# Patient Record
Sex: Female | Born: 1990 | Race: Black or African American | Hispanic: No | Marital: Single | Smoking: Never smoker
Health system: Southern US, Community
[De-identification: ages and names within clinical notes are randomized; demographics above are authoritative.]

## PROBLEM LIST (undated history)

## (undated) ENCOUNTER — Inpatient Hospital Stay (HOSPITAL_COMMUNITY): Payer: Self-pay

## (undated) DIAGNOSIS — D649 Anemia, unspecified: Secondary | ICD-10-CM

## (undated) DIAGNOSIS — R51 Headache: Secondary | ICD-10-CM

## (undated) DIAGNOSIS — R519 Headache, unspecified: Secondary | ICD-10-CM

## (undated) HISTORY — PX: WISDOM TOOTH EXTRACTION: SHX21

---
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
## 2010-11-21 ENCOUNTER — Inpatient Hospital Stay (INDEPENDENT_AMBULATORY_CARE_PROVIDER_SITE_OTHER)
Admission: RE | Admit: 2010-11-21 | Discharge: 2010-11-21 | Disposition: A | Discharge status: Home / Self Care | Payer: Medicaid Other | Source: Ambulatory Visit | Attending: Family Medicine | Admitting: Family Medicine

## 2010-11-21 DIAGNOSIS — N760 Acute vaginitis: Secondary | ICD-10-CM

## 2010-11-21 DIAGNOSIS — A499 Bacterial infection, unspecified: Secondary | ICD-10-CM

## 2010-11-21 LAB — POCT URINALYSIS DIP (DEVICE)
Glucose, UA: NEGATIVE mg/dL
Hgb urine dipstick: NEGATIVE
Ketones, ur: NEGATIVE mg/dL
Nitrite: NEGATIVE
Protein, ur: NEGATIVE mg/dL
Specific Gravity, Urine: 1.02 (ref 1.005–1.030)
Urobilinogen, UA: 1 mg/dL (ref 0.0–1.0)
pH: 7 (ref 5.0–8.0)

## 2010-11-21 LAB — WET PREP, GENITAL
Clue Cells Wet Prep HPF POC: NONE SEEN
Trich, Wet Prep: NONE SEEN

## 2010-11-22 LAB — GC/CHLAMYDIA PROBE AMP, GENITAL: GC Probe Amp, Genital: NEGATIVE

## 2010-11-29 ENCOUNTER — Emergency Department (HOSPITAL_COMMUNITY)
Admission: EM | Admit: 2010-11-29 | Discharge: 2010-11-29 | Disposition: A | Discharge status: Home / Self Care | Payer: Medicaid Other | Attending: Emergency Medicine | Admitting: Emergency Medicine

## 2010-11-29 DIAGNOSIS — X58XXXA Exposure to other specified factors, initial encounter: Secondary | ICD-10-CM | POA: Insufficient documentation

## 2010-11-29 DIAGNOSIS — M545 Low back pain, unspecified: Secondary | ICD-10-CM | POA: Insufficient documentation

## 2010-11-29 DIAGNOSIS — S335XXA Sprain of ligaments of lumbar spine, initial encounter: Secondary | ICD-10-CM | POA: Insufficient documentation

## 2010-11-29 LAB — URINALYSIS, ROUTINE W REFLEX MICROSCOPIC
Bilirubin Urine: NEGATIVE
Nitrite: NEGATIVE
Urobilinogen, UA: 0.2 mg/dL (ref 0.0–1.0)

## 2010-11-29 LAB — URINE MICROSCOPIC-ADD ON

## 2011-05-15 LAB — CBC
HCT: 34.1 — ABNORMAL LOW
HCT: 37.3
Hemoglobin: 11.7 — ABNORMAL LOW
Hemoglobin: 12.8
MCV: 97.5
Platelets: 199
Platelets: 212
WBC: 12.9 — ABNORMAL HIGH
WBC: 8.3

## 2011-05-15 LAB — RPR: RPR Ser Ql: NONREACTIVE

## 2011-05-25 LAB — CBC
MCHC: 34.4
MCV: 96.1
RBC: 4.15
WBC: 7.7

## 2011-05-25 LAB — RPR: RPR Ser Ql: NONREACTIVE

## 2014-11-06 ENCOUNTER — Emergency Department (INDEPENDENT_AMBULATORY_CARE_PROVIDER_SITE_OTHER): Payer: Medicaid Other

## 2014-11-06 ENCOUNTER — Encounter (HOSPITAL_COMMUNITY): Payer: Self-pay

## 2014-11-06 ENCOUNTER — Emergency Department (INDEPENDENT_AMBULATORY_CARE_PROVIDER_SITE_OTHER)
Admission: EM | Admit: 2014-11-06 | Discharge: 2014-11-06 | Disposition: A | Discharge status: Home / Self Care | Payer: Self-pay | Source: Home / Self Care | Attending: Emergency Medicine | Admitting: Emergency Medicine

## 2014-11-06 DIAGNOSIS — J209 Acute bronchitis, unspecified: Secondary | ICD-10-CM

## 2014-11-06 MED ORDER — PREDNISONE 50 MG PO TABS: ORAL_TABLET | ORAL | Status: DC

## 2014-11-06 MED ORDER — AZITHROMYCIN 250 MG PO TABS: ORAL_TABLET | ORAL | Status: DC

## 2014-11-06 NOTE — Discharge Instructions (Signed)
You have acute bronchitis. Take prednisone and azithromycin as prescribed. You should start to feel better in the next 2-3 days. If you develop high fevers, vomiting, or are getting more short of breath, please go to the emergency room.

## 2014-11-06 NOTE — ED Notes (Signed)
C/o SOB since yesterday, minimal relief w OTC medications. Laying down on bed, talking on phone at time of DC. NAD

## 2014-11-06 NOTE — ED Provider Notes (Signed)
CSN: [id number]     Arrival date & time 11/06/14  1247 History   First MD Initiated Contact with Patient 11/06/14 1321     Chief Complaint  Patient presents with  . Shortness of Breath   (Consider location/radiation/quality/duration/timing/severity/associated sxs/prior Treatment) HPI  She is a 24 year old woman here for evaluation of shortness of breath. She states she has had some nasal congestion for the last week or so. She has also had a cough. Last night she woke up from her stuffy nose and took some Alka-Seltzer plus. An hour or 2 later she woke up again feeling short of breath and with some chest pain. She reports continued shortness of breath and chest discomfort.  She also describes a sore throat. The chest discomfort and sore throat are worse with coughing. No nausea or vomiting. No fevers.  History reviewed. No pertinent past medical history. History reviewed. No pertinent past surgical history. History reviewed. No pertinent family history. History  Substance Use Topics  . Smoking status: Never Smoker   . Smokeless tobacco: Not on file  . Alcohol Use: No   OB History    No data available     Review of Systems  Constitutional: Negative for fever.  HENT: Positive for congestion and sore throat. Negative for rhinorrhea.   Respiratory: Positive for cough and shortness of breath.   Cardiovascular: Positive for chest pain.  Gastrointestinal: Negative for nausea and vomiting.    Allergies  Review of patient's allergies indicates no known allergies.  Home Medications   Prior to Admission medications   Medication Sig Start Date End Date Taking? Authorizing Provider  azithromycin (ZITHROMAX Z-PAK) 250 MG tablet Take 2 pills today, then 1 pill daily until gone. 11/06/14   Charm RingsErin J Honig, MD  predniSONE (DELTASONE) 50 MG tablet Take 1 pill daily for 5 days. 11/06/14   Charm RingsErin J Honig, MD   BP 102/66 mmHg  Pulse 145  Temp(Src) 97.4 F (36.3 C) (Oral)  SpO2 98%  LMP 10/30/2014  (Exact Date) Physical Exam  Constitutional: She is oriented to person, place, and time. She appears well-developed and well-nourished. No distress.  HENT:  Mouth/Throat: Posterior oropharyngeal erythema present. No oropharyngeal exudate.  Neck: Neck supple.  Cardiovascular: S1 normal and S2 normal.  An irregular rhythm present. Tachycardia present.   No murmur heard. Pulmonary/Chest: Effort normal and breath sounds normal. No respiratory distress. She has no wheezes. She has no rales.  Lymphadenopathy:    She has no cervical adenopathy.  Neurological: She is alert and oriented to person, place, and time.    ED Course  Procedures (including critical care time) ED ECG REPORT   Date: 11/06/2014  Rate: 85  Rhythm: normal sinus rhythm  QRS Axis: normal  Intervals: normal  ST/T Wave abnormalities: normal  Conduction Disutrbances:none  Narrative Interpretation: Normal ekg  Old EKG Reviewed: none available  I have personally reviewed the EKG tracing and agree with the computerized printout as noted.  Labs Review Labs Reviewed - No data to display  Imaging Review Dg Chest 2 View  11/06/2014   CLINICAL DATA:  Two day history of cough and shortness of breath  EXAM: CHEST  2 VIEW  COMPARISON:  None.  FINDINGS: Lungs are clear. Heart size and pulmonary vascularity are normal. No adenopathy. 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  IMPRESSION: No edema or consolidation.   Electronically Signed   By: Bretta BangWilliam  Woodruff III M.D.   On: 11/06/2014 14:08     MDM   1. Acute  bronchitis, unspecified organism    We'll treat with prednisone and Z-Pak. Return precautions reviewed as in after visit summary.    Charm Rings, MD 11/06/14 [phone]

## 2016-01-04 ENCOUNTER — Encounter (HOSPITAL_COMMUNITY): Payer: Self-pay | Admitting: *Deleted

## 2016-01-04 ENCOUNTER — Inpatient Hospital Stay (HOSPITAL_COMMUNITY)
Admission: AD | Admit: 2016-01-04 | Discharge: 2016-01-04 | Disposition: A | Discharge status: Home / Self Care | Payer: Medicaid Other | Source: Ambulatory Visit | Attending: Obstetrics & Gynecology | Admitting: Obstetrics & Gynecology

## 2016-01-04 DIAGNOSIS — R102 Pelvic and perineal pain: Secondary | ICD-10-CM | POA: Insufficient documentation

## 2016-01-04 DIAGNOSIS — N946 Dysmenorrhea, unspecified: Secondary | ICD-10-CM | POA: Insufficient documentation

## 2016-01-04 DIAGNOSIS — G8929 Other chronic pain: Secondary | ICD-10-CM | POA: Insufficient documentation

## 2016-01-04 LAB — URINE MICROSCOPIC-ADD ON

## 2016-01-04 LAB — URINALYSIS, ROUTINE W REFLEX MICROSCOPIC
Bilirubin Urine: NEGATIVE
Glucose, UA: NEGATIVE mg/dL
KETONES UR: NEGATIVE mg/dL
LEUKOCYTES UA: NEGATIVE
NITRITE: NEGATIVE
Protein, ur: NEGATIVE mg/dL
SPECIFIC GRAVITY, URINE: 1.01 (ref 1.005–1.030)
pH: 6 (ref 5.0–8.0)

## 2016-01-04 LAB — POCT PREGNANCY, URINE: PREG TEST UR: NEGATIVE

## 2016-01-04 MED ORDER — OXYCODONE-ACETAMINOPHEN 5-325 MG PO TABS: 1.0000 | ORAL_TABLET | Freq: Four times a day (QID) | ORAL | Status: DC | PRN

## 2016-01-04 MED ORDER — OXYCODONE-ACETAMINOPHEN 5-325 MG PO TABS
2.0000 | ORAL_TABLET | Freq: Once | ORAL | Status: AC
  Administered 2016-01-04: 2 via ORAL
  Filled 2016-01-04: qty 2

## 2016-01-04 NOTE — MAU Provider Note (Signed)
History     CSN: [id number]  Arrival date and time: 01/04/16 [id number]   First Provider Initiated Contact with Patient 01/04/16 2002      Chief Complaint  Patient presents with  . Abdominal Pain   HPI Ms. Diane [name] is a 25 y.o. [id number] who presents to MAU today with complaint of abdominal cramping. The patient states that pain has been present x 2 years and is worse with periods. [facility] and had negative infection testing within the last few months. She is sexually active, but denies any new partners or abnormal vaginal discharge. She states that she was put on OCPs at that time. She is unsure which brand of OCPs, but states that she is only supposed to have a period q 3 months. LMP started on Sunday as scheduled with OCPs. She has had breakthrough spotting occasionally since starting OCPs as well. She does not feel that pain has improved at all over the last 3 months. She rates pain at 7/10 now. She takes Ibuprofen and Tylenol without relief. She states that she has used 4 pads today. Periods generally last ~ 7 days.   OB History    Gravida Para Term Preterm AB TAB SAB Ectopic Multiple Living   2 2 2       2       Past Medical History  Diagnosis Date  . Medical history non-contributory     Past Surgical History  Procedure Laterality Date  . No past surgeries      History reviewed. No pertinent family history.  Social History  Substance Use Topics  . Smoking status: Never Smoker   . Smokeless tobacco: Never Used  . Alcohol Use: No    Allergies: No Known Allergies  No prescriptions prior to admission    Review of Systems  Constitutional: Negative for fever and malaise/fatigue.  Gastrointestinal: Positive for abdominal pain. Negative for nausea, vomiting, diarrhea and constipation.  Genitourinary: Negative for dysuria, urgency and frequency.       + vaginal bleeding   Physical Exam   Blood pressure 122/76, pulse 97, temperature 98 F (36.7 C),  temperature source Oral, resp. rate 16, height 5\' 6"  (1.676 m), weight 145 lb (65.772 kg), last menstrual period 01/01/2016.  Physical Exam  Nursing note and vitals reviewed. Constitutional: She is oriented to person, place, and time. She appears well-developed and well-nourished. No distress.  HENT:  Head: Normocephalic and atraumatic.  Cardiovascular: Normal rate.   Respiratory: Effort normal.  GI: Soft. She exhibits no distension and no mass. There is tenderness (mild midline suprapubic tenderness to palpation). There is no rebound and no guarding.  Neurological: She is alert and oriented to person, place, and time.  Skin: Skin is warm and dry. No erythema.  Psychiatric: She has a normal mood and affect.     Results for orders placed or performed during the hospital encounter of 01/04/16 (from the past 24 hour(s))  Urinalysis, Routine w reflex microscopic (not [facility])     Status: Abnormal   Collection Time: 01/04/16  7:35 PM  Result Value Ref Range   Color, Urine YELLOW YELLOW   APPearance CLEAR CLEAR   Specific Gravity, Urine 1.010 1.005 - 1.030   pH 6.0 5.0 - 8.0   Glucose, UA NEGATIVE NEGATIVE mg/dL   Hgb urine dipstick LARGE (A) NEGATIVE   Bilirubin Urine NEGATIVE NEGATIVE   Ketones, ur NEGATIVE NEGATIVE mg/dL   Protein, ur NEGATIVE NEGATIVE mg/dL   Nitrite  NEGATIVE NEGATIVE   Leukocytes, UA NEGATIVE NEGATIVE  Urine microscopic-add on     Status: Abnormal   Collection Time: 01/04/16  7:35 PM  Result Value Ref Range   Squamous Epithelial / LPF 0-5 (A) NONE SEEN   WBC, UA 0-5 0 - 5 WBC/hpf   RBC / HPF 0-5 0 - 5 RBC/hpf   Bacteria, UA RARE (A) NONE SEEN  Pregnancy, urine POC     Status: None   Collection Time: 01/04/16  8:09 PM  Result Value Ref Range   Preg Test, Ur NEGATIVE NEGATIVE     MAU Course  Procedures None  MDM UPT - negative UA today  2 Percocet for pain Pelvic exam deferred as infection testing is not needed today and bleeding is not  heavy.  Assessment and Plan  A: Chronic pelvic pain Dysmenorrhea  P: Discharge home Rx for Percocet given to patient  Warning signs for worsening conditino discussed Patient advised to continue Ibuprofen for breakthrough pain Outpatient pelvic US and transvaginal US ordered. They will call patient with appointment. Results will be discussed at [facility] follow-up visit.  Patient referred to [facility] for follow-up and management. They will call patient with appointment.  Patient may return to MAU as needed or if her condition were to change or worsen   Marny Lowenstein, PA-C  01/04/2016, 11:13 PM

## 2016-01-04 NOTE — Discharge Instructions (Signed)
Contraception Choices Birth control (contraception) is the use of any methods or devices to stop pregnancy from happening. Below are some methods to help avoid pregnancy. HORMONAL BIRTH CONTROL  A small tube put under the skin of the upper arm (implant). The tube can stay in place for 3 years. The implant must be taken out after 3 years.  Shots given every 3 months.  Pills taken every day.  Patches that are changed once a week.  A ring put into the vagina (vaginal ring). The ring is left in place for 3 weeks and removed for 1 week. Then, a new ring is put in the vagina.  Emergency birth control pills taken after unprotected sex (intercourse). BARRIER BIRTH CONTROL   A thin covering worn on the penis (female condom) during sex.  A soft, loose covering put into the vagina (female condom) before sex.  A rubber bowl that sits over the cervix (diaphragm). The bowl must be made for you. The bowl is put into the vagina before sex. The bowl is left in place for 6 to 8 hours after sex.  A small, soft cup that fits over the cervix (cervical cap). The cup must be made for you. The cup can be left in place for 48 hours after sex.  A sponge that is put into the vagina before sex.  A chemical that kills or stops sperm from getting into the cervix and uterus (spermicide). The chemical may be a cream, jelly, foam, or pill. INTRAUTERINE (IUD) BIRTH CONTROL   IUD birth control is a small, T-shaped piece of plastic. The plastic is put inside the uterus. There are 2 types of IUD:  Copper IUD. The IUD is covered in copper wire. The copper makes a fluid that kills sperm. It can stay in place for 10 years.  Hormone IUD. The hormone stops pregnancy from happening. It can stay in place for 5 years. PERMANENT METHODS  When the woman has her fallopian tubes sealed, tied, or blocked during surgery. This stops the egg from traveling to the uterus.  The doctor places a small coil or insert into each fallopian  tube. This causes scar tissue to form and blocks the fallopian tubes.  When the female has the tubes that carry sperm tied off (vasectomy). NATURAL FAMILY PLANNING BIRTH CONTROL   Natural family planning means not having sex or using barrier birth control on the days the woman could become pregnant.  Use a calendar to keep track of the length of each period and know the days she can get pregnant.  Avoid sex during ovulation.  Use a thermometer to measure body temperature. Also watch for symptoms of ovulation.  Time sex to be after the woman has ovulated. Use condoms to help protect yourself against sexually transmitted infections (STIs). Do this no matter what type of birth control you use. Talk to your doctor about which type of birth control is best for you.   This information is not intended to replace advice given to you by your health care provider. Make sure you discuss any questions you have with your health care provider.   Document Released: 05/27/2009 Document Revised: 08/04/2013 Document Reviewed: 02/18/2013 Elsevier Interactive Patient Education 2016 Elsevier Inc. Pelvic Pain, Female Pelvic pain is pain felt below the belly button and between your hips. It can be caused by many different things. It is important to get help right away. This is especially true for severe, sharp, or unusual pain that comes on suddenly.  HOME CARE  Only take medicine as told by your doctor.  Rest as told by your doctor.  Eat a healthy diet, such as fruits, vegetables, and lean meats.  Drink enough fluids to keep your pee (urine) clear or pale yellow, or as told.  Avoid sex (intercourse) if it causes pain.  Apply warm or cold packs to your lower belly (abdomen). Use the type of pack that helps the pain.  Avoid situations that cause you stress.  Keep a journal to track your pain. Write down:  When the pain started.  Where it is located.  If there are things that seem to be related to  the pain, such as food or your period.  Follow up with your doctor as told. GET HELP RIGHT AWAY IF:   You have heavy bleeding from the vagina.  You have more pelvic pain.  You feel lightheaded or pass out (faint).  You have chills.  You have pain when you pee or have blood in your pee.  You cannot stop having watery poop (diarrhea).  You cannot stop throwing up (vomiting).  You have a fever or lasting symptoms for more than 3 days.  You have a fever and your symptoms suddenly get worse.  You are being physically or sexually abused.  Your medicine does not help your pain.  You have fluid (discharge) coming from your vagina that is not normal. MAKE SURE YOU:  Understand these instructions.  Will watch your condition.  Will get help if you are not doing well or get worse.   This information is not intended to replace advice given to you by your health care provider. Make sure you discuss any questions you have with your health care provider.   Document Released: 01/16/2008 Document Revised: 08/20/2014 Document Reviewed: 11/19/2011 Elsevier Interactive Patient Education [employer] Elsevier Inc.

## 2016-01-04 NOTE — MAU Note (Signed)
Patient presents stating she is not pregnant with c/o abdominal cramping X 2 years. On her menstrual cycle now.

## 2016-01-27 ENCOUNTER — Ambulatory Visit (HOSPITAL_COMMUNITY)
Admission: RE | Admit: 2016-01-27 | Discharge: 2016-01-27 | Disposition: A | Discharge status: Home / Self Care | Payer: Medicaid Other | Source: Ambulatory Visit | Attending: Medical | Admitting: Medical

## 2016-01-27 DIAGNOSIS — R102 Pelvic and perineal pain: Secondary | ICD-10-CM | POA: Insufficient documentation

## 2016-01-27 DIAGNOSIS — G8929 Other chronic pain: Secondary | ICD-10-CM | POA: Insufficient documentation

## 2016-01-27 DIAGNOSIS — N854 Malposition of uterus: Secondary | ICD-10-CM | POA: Insufficient documentation

## 2016-01-27 DIAGNOSIS — N949 Unspecified condition associated with female genital organs and menstrual cycle: Secondary | ICD-10-CM | POA: Insufficient documentation

## 2016-01-30 ENCOUNTER — Other Ambulatory Visit (HOSPITAL_COMMUNITY)
Admission: RE | Admit: 2016-01-30 | Discharge: 2016-01-30 | Disposition: A | Discharge status: Home / Self Care | Payer: Medicaid Other | Source: Ambulatory Visit | Attending: Obstetrics and Gynecology | Admitting: Obstetrics and Gynecology

## 2016-01-30 ENCOUNTER — Encounter: Payer: Self-pay | Admitting: Obstetrics and Gynecology

## 2016-01-30 ENCOUNTER — Ambulatory Visit (INDEPENDENT_AMBULATORY_CARE_PROVIDER_SITE_OTHER): Payer: Medicaid Other | Admitting: Obstetrics and Gynecology

## 2016-01-30 VITALS — BP 106/70 | HR 83 | Ht 66.0 in | Wt 143.0 lb

## 2016-01-30 DIAGNOSIS — Z3041 Encounter for surveillance of contraceptive pills: Secondary | ICD-10-CM | POA: Diagnosis present

## 2016-01-30 DIAGNOSIS — N946 Dysmenorrhea, unspecified: Secondary | ICD-10-CM | POA: Diagnosis not present

## 2016-01-30 DIAGNOSIS — Z124 Encounter for screening for malignant neoplasm of cervix: Secondary | ICD-10-CM | POA: Diagnosis not present

## 2016-01-30 DIAGNOSIS — N920 Excessive and frequent menstruation with regular cycle: Secondary | ICD-10-CM | POA: Diagnosis not present

## 2016-01-30 DIAGNOSIS — Z01419 Encounter for gynecological examination (general) (routine) without abnormal findings: Secondary | ICD-10-CM | POA: Insufficient documentation

## 2016-01-30 NOTE — Progress Notes (Signed)
Obstetrics and Gynecology Visit New Patient Evaluation  Appointment Date: 01/30/2016  Primary Care Provider: [facility]  Referring Provider: Self  Chief Complaint:  Chief Complaint  Patient presents with  . Pelvic Pain  . Menorrhagia    heavy periods & severe cramps     History of Present Illness: Diane [name] is a 25 y.o. African-American [id number] (Patient's last menstrual period was 01/01/2016.), seen for the above chief complaint. Her past medical history is significant for h/o dysmenorrhea, BMI 23, remote h/o STIs  Patient went to MAU for dysmenorrhea and slightly heavy VB during her period on 5/24. She was being seen at [facility] and has been on continuous OCPs for the past 6 months or so and was on her scheduled 57m withdrawal bleed when she came into MAU. She had occasional BTB with the first 24m but is on a different type of OCP for continuous (name unknown) and hasn't had any issues since her LMP.  No real pain or AUB outside of her periods. Has been tested for STIs at [facility] this year and negative and negative u/a and UPT in the MAU. She had an u/s done in 01/2016 was negative with RV 9 x 4 x 5cm uterus, RV, ES 9mm and normal ovaries and no FF in the pelvis.   Last pap unknown. Patient does states she's had a h/o abnormal pap with colpo needed  H/o depo use x 2 years but stopped due to weight loss Nexplanon x 70m but stopped due to AUB.   Review of Systems: no fevers, chills, nausea, vomiting, chest pain, SOB, current abdominal pain, VB, dysuria, hematuria, diarrhea, constipation, blood in BMs. Occasional dyspareunia  Past Medical History:  Past Medical History  Diagnosis Date  . Medical history non-contributory     Past Surgical History:  Past Surgical History  Procedure Laterality Date  . No past surgeries      Past Obstetrical History:  OB History    Gravida Para Term Preterm AB TAB SAB Ectopic Multiple Living   SVD x 2  Past Gynecological History:  As per HPI.  Social History:  Social History   Social History  . Marital Status: Single    Spouse Name: N/A  . Number of Children: N/A  . Years of Education: N/A   Occupational History  . Not on file.   Social History Main Topics  . Smoking status: Never Smoker   . Smokeless tobacco: Never Used  . Alcohol Use: No  . Drug Use: No  . Sexual Activity: Yes    Birth Control/ Protection: Pill   Other Topics Concern  . Not on file   Social History Narrative    Family History: ?h/o breast cancer in a grandmother in her 7s   Medications OCP  Allergies Review of patient's allergies indicates no known allergies.   Physical Exam:  BP 106/70 mmHg  Pulse 83  Ht  (1.676 m)  Wt 143 lb (64.864 kg)  BMI 23.09 kg/m2  LMP 01/01/2016 Body mass index is 23.09 kg/(m^2). General appearance: Well nourished, well developed female in no acute distress.  Neck:  Supple, normal appearance, and no thyromegaly  Cardiovascular: normal s1 and s2.  No murmurs, rubs or gallops. Respiratory:  Clear to auscultation bilateral. Normal respiratory effort Abdomen: positive bowel sounds and no masses, hernias; diffusely non tender to palpation, non distended Neuro/Psych:  Normal mood and affect.  Skin:  Warm and dry.  Lymphatic:  No inguinal lymphadenopathy.   Pelvic exam: is not limited by body habitus EGBUS: within normal limits Vagina: within normal limits and with no blood in the vault, Cervix:  no lesions or cervical motion tenderness Uterus:  nonenlarged and approximately 8 week sized Adnexa:  normal adnexa and no mass, fullness, tenderness Rectovaginal: deferred  Laboratory: pap today  Radiology: as above  Assessment: Menorrhagia and dysmenorrhea  Plan: Follow up pap from today D/w her that she seems to have pain and bleeding that's during her period with just occasional pain during her periods. I told her that her s/s and time course doesn't seem to line up with endometriosis  but the continuous OCPs helped until she had her period. She used OTCs in the past but only with period starting. I told her to do NSAIDs two days prior to her period is to start and to stay on her continuous OCPs. I told her that we will follow up in 3 months after her next period to see how her s/s are at that time.   RTC 3 months  [place] Bingharlie Daisie Haft, Montez HagemanJr MD Attending Center for [facility](Faculty Practice)

## 2016-01-30 NOTE — Patient Instructions (Signed)
Start the motrin, ibuprofen, or Aleve two days prior to when your periods are to start We will see you back in 3 months.

## 2016-02-01 LAB — CYTOLOGY - PAP

## 2016-06-20 ENCOUNTER — Encounter (HOSPITAL_COMMUNITY): Payer: Self-pay | Admitting: *Deleted

## 2016-06-20 ENCOUNTER — Inpatient Hospital Stay (HOSPITAL_COMMUNITY)
Admission: AD | Admit: 2016-06-20 | Discharge: 2016-06-20 | Disposition: A | Discharge status: Home / Self Care | Payer: Medicaid Other | Source: Ambulatory Visit | Attending: Family Medicine | Admitting: Family Medicine

## 2016-06-20 DIAGNOSIS — R102 Pelvic and perineal pain: Secondary | ICD-10-CM | POA: Diagnosis present

## 2016-06-20 DIAGNOSIS — N946 Dysmenorrhea, unspecified: Secondary | ICD-10-CM | POA: Diagnosis not present

## 2016-06-20 LAB — URINE MICROSCOPIC-ADD ON

## 2016-06-20 LAB — POCT PREGNANCY, URINE: PREG TEST UR: NEGATIVE

## 2016-06-20 LAB — URINALYSIS, ROUTINE W REFLEX MICROSCOPIC
Bilirubin Urine: NEGATIVE
Glucose, UA: NEGATIVE mg/dL
Ketones, ur: NEGATIVE mg/dL
NITRITE: NEGATIVE
PROTEIN: NEGATIVE mg/dL
SPECIFIC GRAVITY, URINE: 1.025 (ref 1.005–1.030)
pH: 6 (ref 5.0–8.0)

## 2016-06-20 MED ORDER — KETOROLAC TROMETHAMINE 60 MG/2ML IM SOLN
60.0000 mg | Freq: Once | INTRAMUSCULAR | Status: AC
  Administered 2016-06-20: 60 mg via INTRAMUSCULAR
  Filled 2016-06-20: qty 2

## 2016-06-20 MED ORDER — NAPROXEN 500 MG PO TABS: 500.0000 mg | ORAL_TABLET | Freq: Two times a day (BID) | ORAL | 1 refills | Status: DC

## 2016-06-20 NOTE — MAU Note (Signed)
Pt reports bleeding since yesterday. Pain started @ 0215 this morning. Pt took 4 ibuprofen at 6 pm. Pt has an appointment on November 13 in the clinic.

## 2016-06-20 NOTE — MAU Provider Note (Signed)
History     CSN: [id number]  Arrival date and time: 06/20/16 [id number]   First Provider Initiated Contact with Patient 06/20/16 [phone]      Chief Complaint  Patient presents with  . Pelvic Pain   Diane [name] is a 25 y.o. G2P2002 at who presents today with cramps. She states that she has had bad menstrual cramps for about one year, and has tried a variety of things. She is on Seasonique to decrease periods, but states that when she has her period it is still very painful. She has tried ibuprofen, but is not taking on a scheduled basis. She has tried oxycodone, but does not like the way it makes her feel. She has an appointment on 11/13 for follow-up.    Pelvic Pain  The patient's primary symptoms include pelvic pain. This is a new problem. The current episode started more than 1 year ago. The problem occurs intermittently (only when she has her period. ). The problem has been unchanged. The problem affects both sides. She is not pregnant. Associated symptoms include abdominal pain and nausea. Pertinent negatives include no chills, constipation, diarrhea, dysuria, fever, frequency, urgency or vomiting. The vaginal discharge was bloody. The vaginal bleeding is typical of menses. She has been passing clots. She has been passing tissue. Nothing aggravates the symptoms. She has tried NSAIDs and oral narcotics for the symptoms. The treatment provided no relief. She uses oral contraceptives (seasonique ) for contraception. Menstrual history: LMP 06/19/16     Past Medical History:  Diagnosis Date  . Medical history non-contributory     Past Surgical History:  Procedure Laterality Date  . NO PAST SURGERIES      No family history on file.  Social History  Substance Use Topics  . Smoking status: Never Smoker  . Smokeless tobacco: Never Used  . Alcohol use No    Allergies: No Known Allergies  Prescriptions Prior to Admission  Medication Sig Dispense Refill Last Dose  . ibuprofen  (ADVIL,MOTRIN) 200 MG tablet Take 200 mg by mouth every 6 (six) hours as needed.     . Levonorgestrel-Ethinyl Estradiol (ASHLYNA) 0.15-0.03 &0.01 MG tablet Take 1 tablet by mouth daily.     . WERA 0.5-35 MG-MCG tablet Take 1 tablet by mouth daily.  0 Taking    Review of Systems  Constitutional: Negative for chills and fever.  Gastrointestinal: Positive for abdominal pain and nausea. Negative for constipation, diarrhea and vomiting.  Genitourinary: Positive for pelvic pain. Negative for dysuria, frequency and urgency.   Physical Exam   Blood pressure 108/63, pulse 99, temperature 97.9 F (36.6 C), temperature source Oral, height 5\' 6"  (1.676 m), weight 144 lb 12 oz (65.7 kg).  Physical Exam  Nursing note and vitals reviewed. Constitutional: She is oriented to person, place, and time. She appears well-developed and well-nourished. No distress.  HENT:  Head: Normocephalic.  Cardiovascular: Normal rate.   Respiratory: Effort normal.  GI: Soft. There is no tenderness. There is no rebound.  Neurological: She is alert and oriented to person, place, and time.  Skin: Skin is warm.  Psychiatric: She has a normal mood and affect.   Results for orders placed or performed during the hospital encounter of 06/20/16 (from the past 24 hour(s))  Urinalysis, Routine w reflex microscopic (not at [facility])     Status: Abnormal   Collection Time: 06/20/16  6:00 AM  Result Value Ref Range   Color, Urine YELLOW YELLOW   APPearance CLEAR CLEAR   Specific  Gravity, Urine 1.025 1.005 - 1.030   pH 6.0 5.0 - 8.0   Glucose, UA NEGATIVE NEGATIVE mg/dL   Hgb urine dipstick LARGE (A) NEGATIVE   Bilirubin Urine NEGATIVE NEGATIVE   Ketones, ur NEGATIVE NEGATIVE mg/dL   Protein, ur NEGATIVE NEGATIVE mg/dL   Nitrite NEGATIVE NEGATIVE   Leukocytes, UA TRACE (A) NEGATIVE  Urine microscopic-add on     Status: Abnormal   Collection Time: 06/20/16  6:00 AM  Result Value Ref Range   Squamous Epithelial / LPF 0-5 (A)  NONE SEEN   WBC, UA 6-30 0 - 5 WBC/hpf   RBC / HPF TOO NUMEROUS TO COUNT 0 - 5 RBC/hpf   Bacteria, UA FEW (A) NONE SEEN    MAU Course  Procedures  MDM Patient has had toradol. She is feeling better.   Assessment and Plan   1. Dysmenorrhea    DC home Comfort measures reviewed  Bleeding precautions RX: naproxen 500mg  BID  Return to MAU as needed FU with GYN as planned  Follow-up Information    [facility].   Specialty:  Obstetrics and Gynecology Contact information: [address] [place] [id number] [phone]          Tawnya CrookHogan, Heather Donovan 06/20/2016, 6:28 AM

## 2016-06-20 NOTE — MAU Note (Signed)
PT  SAYS  HAS BEEN HERE BEFORE  IN 03-2016.  TAKES  BCP-   HAS CYCLE Q 3 MTHS.    STARTED HURTING  BAD  WITH CRAMPING  AT 0215.  YESTERDAY  TOOK  IBUPROFEN-  TODAY - NOTHING

## 2016-06-20 NOTE — Discharge Instructions (Signed)
Dysmenorrhea Menstrual cramps (dysmenorrhea) are caused by the muscles of the uterus tightening (contracting) during a menstrual period. For some women, this discomfort is merely bothersome. For others, dysmenorrhea can be severe enough to interfere with everyday activities for a few days each month. Primary dysmenorrhea is menstrual cramps that last a couple of days when you start having menstrual periods or soon after. This often begins after a teenager starts having her period. As a woman gets older or has a baby, the cramps will usually lessen or disappear. Secondary dysmenorrhea begins later in life, lasts longer, and the pain may be stronger than primary dysmenorrhea. The pain may start before the period and last a few days after the period.  CAUSES  Dysmenorrhea is usually caused by an underlying problem, such as:  The tissue lining the uterus grows outside of the uterus in other areas of the body (endometriosis).  The endometrial tissue, which normally lines the uterus, is found in or grows into the muscular walls of the uterus (adenomyosis).  The pelvic blood vessels are engorged with blood just before the menstrual period (pelvic congestive syndrome).  Overgrowth of cells (polyps) in the lining of the uterus or cervix.  Falling down of the uterus (prolapse) because of loose or stretched ligaments.  Depression.  Bladder problems, infection, or inflammation.  Problems with the intestine, a tumor, or irritable bowel syndrome.  Cancer of the female organs or bladder.  A severely tipped uterus.  A very tight opening or closed cervix.  Noncancerous tumors of the uterus (fibroids).  Pelvic inflammatory disease (PID).  Pelvic scarring (adhesions) from a previous surgery.  Ovarian cyst. RISK FACTORS You may be at greater risk of dysmenorrhea if:  You are younger than age 330.  You started puberty early.  You have irregular or heavy bleeding.  You have never given  birth.  You have a family history of this problem.  You are a smoker. SIGNS AND SYMPTOMS   Cramping or throbbing pain in your lower abdomen.  Headaches.  Lower back pain.  Nausea or vomiting.  Diarrhea.  Sweating or dizziness.  Loose stools. DIAGNOSIS  A diagnosis is based on your history, symptoms, physical exam, diagnostic tests, or procedures. Diagnostic tests or procedures may include:  Blood tests.  Ultrasonography.  An examination of the lining of the uterus (dilation and curettage, D&C).  An examination inside your abdomen or pelvis with a scope (laparoscopy).  X-rays.  CT scan.  MRI.  An examination inside the bladder with a scope (cystoscopy).  An examination inside the intestine or stomach with a scope (colonoscopy, gastroscopy). TREATMENT  Treatment depends on the cause of the dysmenorrhea. Treatment may include:  Pain medicine prescribed by your health care provider.  Birth control pills or an IUD with progesterone hormone in it.  Hormone replacement therapy.  Nonsteroidal anti-inflammatory drugs (NSAIDs). These may help stop the production of prostaglandins.  Surgery to remove adhesions, endometriosis, ovarian cyst, or fibroids.  Removal of the uterus (hysterectomy).  Progesterone shots to stop the menstrual period.  Cutting the nerves on the sacrum that go to the female organs (presacral neurectomy).  Electric current to the sacral nerves (sacral nerve stimulation).  Antidepressant medicine.  Psychiatric therapy, counseling, or group therapy.  Exercise and physical therapy.  Meditation and yoga therapy.  Acupuncture. HOME CARE INSTRUCTIONS   Only take over-the-counter or prescription medicines as directed by your health care provider.  Place a heating pad or hot water bottle on your lower back or  abdomen. Do not sleep with the heating pad.  Use aerobic exercises, walking, swimming, biking, and other exercises to help lessen the  cramping.  Massage to the lower back or abdomen may help.  Stop smoking.  Avoid alcohol and caffeine. SEEK MEDICAL CARE IF:   Your pain does not get better with medicine.  You have pain with sexual intercourse.  Your pain increases and is not controlled with medicines.  You have abnormal vaginal bleeding with your period.  You develop nausea or vomiting with your period that is not controlled with medicine. SEEK IMMEDIATE MEDICAL CARE IF:  You pass out.    This information is not intended to replace advice given to you by your health care provider. Make sure you discuss any questions you have with your health care provider.   Document Released: 07/30/2005 Document Revised: 04/01/2013 Document Reviewed: 01/15/2013 Elsevier Interactive Patient Education [employer]2016 Elsevier Inc.

## 2016-06-20 NOTE — MAU Note (Signed)
IN TRIAGE  -  PAD    RED/  BROWN    VAG  BLEEDING-   MOD  AMT

## 2016-06-25 ENCOUNTER — Encounter: Payer: Self-pay | Admitting: Obstetrics and Gynecology

## 2016-06-25 ENCOUNTER — Ambulatory Visit (INDEPENDENT_AMBULATORY_CARE_PROVIDER_SITE_OTHER): Payer: Self-pay | Admitting: Obstetrics and Gynecology

## 2016-06-25 VITALS — BP 110/88 | HR 87 | Wt 149.0 lb

## 2016-06-25 DIAGNOSIS — R102 Pelvic and perineal pain: Secondary | ICD-10-CM

## 2016-06-25 DIAGNOSIS — G8929 Other chronic pain: Secondary | ICD-10-CM

## 2016-06-25 NOTE — Progress Notes (Signed)
Obstetrics and Gynecology Visit Return Patient Evaluation  Appointment Date: 06/25/2016  OB[facility]N Clinic: [facility]  [facility]  Referring Provider: Self  Chief Complaint:  Chief Complaint  Patient presents with  . Follow-up    Interval History:  At her 6/19 last visit, her pap smear was negative and plans made for NSAIDs around the time of menses and to do continuous OCPs. LMP early November and prior to this was back in August. She states her pain is worse during her periods and that her periods are heavy and painful and last for aobut 7 days. She states she also has pains about every week that last for a few minutes that feels like labor pains; she also states that she has some pain with intercourse.  She went to [facility] when she had her period in early November and was just advised NSAIDs; UPT, u/a negative  She denies any blood in her BMs, dysuria, hematuria.   History of Present Illness: Diane [name] is a 25 y.o. African-American [id number], seen for the above chief complaint. Her past medical history is significant for h/o dysmenorrhea, BMI 23, remote h/o STIs  Patient went to [facility] for dysmenorrhea and slightly heavy VB during her period on 5/24. [facility] and has been on continuous OCPs for the past 6 months or so and was on her scheduled 5655m withdrawal bleed when she came into [facility]U. She had occasional BTB with the first 3655m but is on a different type of OCP for continuous (name unknown) and hasn't had any issues since her LMP.  No real pain or AUB outside of her periods. [facility] and negative and negative u/a and UPT in the [facility]. She had an u/s done in 01/2016 was negative with RV 9 x 4 x 5cm uterus, RV, ES 9mm and normal ovaries and no FF in the pelvis.   Last pap unknown. Patient does states she's had a h/o abnormal pap with colpo needed  H/o depo use x 2 years but stopped due to weight  loss Nexplanon x 2170m but stopped due to AUB.   Review of Systems: as per HPI.  Past Medical History:  Past Medical History:  Diagnosis Date  . Medical history non-contributory     Past Surgical History:  Past Surgical History:  Procedure Laterality Date  . NO PAST SURGERIES      Past Obstetrical History:  OB History  Gravida Para Term Preterm AB Living  2 2 2     2   SAB TAB Ectopic Multiple Live Births               # Outcome Date GA Lbr Len/2nd Weight Sex Delivery Anes PTL Lv  2 Term           1 Term               SVD x 2 Past Gynecological History: As per HPI.  Social History:  Social History   Social History  . Marital status: Single    Spouse name: N/A  . Number of children: N/A  . Years of education: N/A   Occupational History  . Not on file.   Social History Main Topics  . Smoking status: Never Smoker  . Smokeless tobacco: Never Used  . Alcohol use No  . Drug use: No  . Sexual activity: Yes    Birth control/ protection: Pill  Other Topics Concern  . Not on file   Social History Narrative  . No narrative on file    Family History: No family history on file. Medications Diane [name] had no medications administered during this visit. Current Outpatient Prescriptions  Medication Sig Dispense Refill  . naproxen (NAPROSYN) 500 MG tablet Take 1 tablet (500 mg total) by mouth 2 (two) times daily with a meal. 60 tablet 1  . WERA 0.5-35 MG-MCG tablet Take 1 tablet by mouth daily.  0   No current facility-administered medications for this visit.     Allergies Patient has no known allergies.   Physical Exam:  BP 110/88   Pulse 87   Wt 149 lb (67.6 kg)   LMP 06/19/2016 (Exact Date)   BMI 24.05 kg/m  Body mass index is 24.05 kg/m. General appearance: Well nourished, well developed female in no acute distress.  Cardiovascular: normal s1 and s2.  No murmurs, rubs or gallops. Respiratory:  Clear to auscultation bilateral. Normal respiratory  effort Abdomen: positive bowel sounds and no masses, hernias; diffusely non tender to palpation, non distended Neuro/Psych:  Normal mood and affect.  Skin:  Warm and dry.  Lymphatic:  No inguinal lymphadenopathy.   From 6/19 Pelvic exam: is not limited by body habitus EGBUS: within normal limits Vagina: within normal limits and with no blood in the vault, Cervix:  no lesions or cervical motion tenderness Uterus:  nonenlarged and approximately 8 week sized Adnexa:  normal adnexa and no mass, fullness, tenderness  Laboratory: as above  Radiology:   Assessment: no new imaging  Plan:  D/w her re: further medical options vs surgery.  I d/w her r/b/a with surgery with the goal to look for any possible endometriosis that could be causing her pain. I told her about either with surgery or trying other medical options (depo provera, LNG IUD, trying a different type of OCP) that she would need to be on something long term. I told her that I'd recommend depo since it sounds like she did fine with this in her teens except she lost weight on it and she would rather try another type of pill.  She states that her pain occurred after being on the depo and nexplanon  After d/w her re: options she'd like to proceed with surgery. Request sent for diagnostic laparoscopy; pt aware that if endometriosis is suspected that larger surgery may be necessary depending on degree of disease seen.   RTC post op  Cornelia Copaharlie Aidynn Polendo, Jr MD Attending [facility](Faculty Practice)

## 2016-07-04 ENCOUNTER — Encounter (HOSPITAL_COMMUNITY): Payer: Self-pay | Admitting: *Deleted

## 2016-07-25 ENCOUNTER — Other Ambulatory Visit: Payer: Self-pay | Admitting: Obstetrics and Gynecology

## 2016-07-25 NOTE — Patient Instructions (Signed)
Your procedure is scheduled on:  Monday, Dec. 18, 2017  [facility] at:  8:15 AM  Pick up the phone at the desk and dial [phone].  Call this number if you have problems the morning of surgery: [phone].  Remember: Do NOT eat food or drink after: Midnight Sunday  Take these medicines the morning of surgery with a SIP OF WATER:  None  Stop ALL herbal medications at this time   Do NOT wear jewelry (body piercing), metal hair clips/bobby pins, make-up, or nail polish. Do NOT wear lotions, powders, or perfumes.  You may wear deodorant. Do NOT shave for 48 hours prior to surgery. Do NOT bring valuables to the hospital. Contacts, dentures, or bridgework may not be worn into surgery.  Have a responsible adult drive you home and stay with you for 24 hours after your procedure

## 2016-07-26 ENCOUNTER — Encounter (HOSPITAL_COMMUNITY)
Admission: RE | Admit: 2016-07-26 | Discharge: 2016-07-26 | Disposition: A | Discharge status: Home / Self Care | Payer: Medicaid Other | Source: Ambulatory Visit | Attending: Obstetrics and Gynecology | Admitting: Obstetrics and Gynecology

## 2016-07-26 ENCOUNTER — Encounter (HOSPITAL_COMMUNITY): Payer: Self-pay

## 2016-07-26 DIAGNOSIS — N946 Dysmenorrhea, unspecified: Secondary | ICD-10-CM | POA: Insufficient documentation

## 2016-07-26 DIAGNOSIS — Z01812 Encounter for preprocedural laboratory examination: Secondary | ICD-10-CM | POA: Insufficient documentation

## 2016-07-26 DIAGNOSIS — Z0183 Encounter for blood typing: Secondary | ICD-10-CM | POA: Diagnosis not present

## 2016-07-26 HISTORY — DX: Headache: R51

## 2016-07-26 HISTORY — DX: Headache, unspecified: R51.9

## 2016-07-26 HISTORY — DX: Anemia, unspecified: D64.9

## 2016-07-26 LAB — CBC
HEMATOCRIT: 37.8 % (ref 36.0–46.0)
Hemoglobin: 13.2 g/dL (ref 12.0–15.0)
MCH: 31.4 pg (ref 26.0–34.0)
MCHC: 34.9 g/dL (ref 30.0–36.0)
MCV: 90 fL (ref 78.0–100.0)
PLATELETS: 305 10*3/uL (ref 150–400)
RBC: 4.2 MIL/uL (ref 3.87–5.11)
RDW: 12.8 % (ref 11.5–15.5)
WBC: 9 10*3/uL (ref 4.0–10.5)

## 2016-07-26 LAB — TYPE AND SCREEN
ABO/RH(D): B POS
Antibody Screen: NEGATIVE

## 2016-07-26 LAB — ABO/RH: ABO/RH(D): B POS

## 2016-07-30 ENCOUNTER — Ambulatory Visit (HOSPITAL_COMMUNITY): Payer: Medicaid Other | Admitting: Anesthesiology

## 2016-07-30 ENCOUNTER — Telehealth: Payer: Self-pay | Admitting: Obstetrics and Gynecology

## 2016-07-30 ENCOUNTER — Ambulatory Visit (HOSPITAL_COMMUNITY)
Admission: RE | Admit: 2016-07-30 | Discharge: 2016-07-30 | Disposition: A | Discharge status: Home / Self Care | Payer: Medicaid Other | Source: Ambulatory Visit | Attending: Obstetrics and Gynecology | Admitting: Obstetrics and Gynecology

## 2016-07-30 ENCOUNTER — Encounter (HOSPITAL_COMMUNITY)
Admission: RE | Disposition: A | Discharge status: Home / Self Care | Payer: Self-pay | Source: Ambulatory Visit | Attending: Obstetrics and Gynecology

## 2016-07-30 ENCOUNTER — Encounter (HOSPITAL_COMMUNITY): Payer: Self-pay

## 2016-07-30 DIAGNOSIS — N736 Female pelvic peritoneal adhesions (postinfective): Secondary | ICD-10-CM | POA: Diagnosis not present

## 2016-07-30 DIAGNOSIS — Z793 Long term (current) use of hormonal contraceptives: Secondary | ICD-10-CM | POA: Insufficient documentation

## 2016-07-30 DIAGNOSIS — R102 Pelvic and perineal pain: Secondary | ICD-10-CM | POA: Diagnosis present

## 2016-07-30 DIAGNOSIS — N946 Dysmenorrhea, unspecified: Secondary | ICD-10-CM

## 2016-07-30 HISTORY — PX: LAPAROSCOPY: SHX197

## 2016-07-30 HISTORY — PX: LAPAROSCOPIC LYSIS OF ADHESIONS: SHX5905

## 2016-07-30 LAB — PREGNANCY, URINE: Preg Test, Ur: NEGATIVE

## 2016-07-30 SURGERY — LAPAROSCOPY, DIAGNOSTIC
Anesthesia: General | Site: Abdomen

## 2016-07-30 MED ORDER — GLYCOPYRROLATE 0.2 MG/ML IJ SOLN
INTRAMUSCULAR | Status: DC | PRN
  Administered 2016-07-30: .1 mg via INTRAVENOUS
  Administered 2016-07-30: .4 mg via INTRAVENOUS
  Administered 2016-07-30: 0.1 mg via INTRAVENOUS

## 2016-07-30 MED ORDER — NEOSTIGMINE METHYLSULFATE 10 MG/10ML IV SOLN
INTRAVENOUS | Status: DC | PRN
  Administered 2016-07-30: 3 mg via INTRAVENOUS

## 2016-07-30 MED ORDER — HYDROMORPHONE HCL 1 MG/ML IJ SOLN
0.2500 mg | INTRAMUSCULAR | Status: DC | PRN
  Administered 2016-07-30: 0.5 mg via INTRAVENOUS

## 2016-07-30 MED ORDER — FENTANYL CITRATE (PF) 250 MCG/5ML IJ SOLN
INTRAMUSCULAR | Status: AC
  Filled 2016-07-30: qty 5

## 2016-07-30 MED ORDER — SILVER NITRATE-POT NITRATE 75-25 % EX MISC
CUTANEOUS | Status: DC | PRN
  Administered 2016-07-30: 2

## 2016-07-30 MED ORDER — ONDANSETRON HCL 4 MG/2ML IJ SOLN
INTRAMUSCULAR | Status: DC | PRN
  Administered 2016-07-30 (×2): 2 mg via INTRAVENOUS

## 2016-07-30 MED ORDER — DEXAMETHASONE SODIUM PHOSPHATE 10 MG/ML IJ SOLN
INTRAMUSCULAR | Status: AC
  Filled 2016-07-30: qty 1

## 2016-07-30 MED ORDER — ONDANSETRON HCL 4 MG/2ML IJ SOLN
INTRAMUSCULAR | Status: AC
  Filled 2016-07-30: qty 2

## 2016-07-30 MED ORDER — PROMETHAZINE HCL 25 MG/ML IJ SOLN: 6.2500 mg | INTRAMUSCULAR | Status: DC | PRN

## 2016-07-30 MED ORDER — LIDOCAINE HCL (CARDIAC) 20 MG/ML IV SOLN
INTRAVENOUS | Status: DC | PRN
  Administered 2016-07-30: 60 mg via INTRAVENOUS

## 2016-07-30 MED ORDER — FENTANYL CITRATE (PF) 100 MCG/2ML IJ SOLN
INTRAMUSCULAR | Status: DC | PRN
  Administered 2016-07-30 (×2): 50 ug via INTRAVENOUS
  Administered 2016-07-30: 100 ug via INTRAVENOUS

## 2016-07-30 MED ORDER — MIDAZOLAM HCL 2 MG/2ML IJ SOLN
INTRAMUSCULAR | Status: DC | PRN
  Administered 2016-07-30: 1.5 mg via INTRAVENOUS
  Administered 2016-07-30: 0.5 mg via INTRAVENOUS

## 2016-07-30 MED ORDER — LIDOCAINE HCL (CARDIAC) 20 MG/ML IV SOLN
INTRAVENOUS | Status: AC
  Filled 2016-07-30: qty 5

## 2016-07-30 MED ORDER — SCOPOLAMINE 1 MG/3DAYS TD PT72
MEDICATED_PATCH | TRANSDERMAL | Status: AC
  Administered 2016-07-30: 1.5 mg via TRANSDERMAL
  Filled 2016-07-30: qty 1

## 2016-07-30 MED ORDER — SILVER NITRATE-POT NITRATE 75-25 % EX MISC
CUTANEOUS | Status: AC
  Filled 2016-07-30: qty 1

## 2016-07-30 MED ORDER — MIDAZOLAM HCL 2 MG/2ML IJ SOLN
INTRAMUSCULAR | Status: AC
  Filled 2016-07-30: qty 2

## 2016-07-30 MED ORDER — DOCUSATE SODIUM 100 MG PO CAPS: 100.0000 mg | ORAL_CAPSULE | Freq: Two times a day (BID) | ORAL | 0 refills | Status: DC

## 2016-07-30 MED ORDER — PROPOFOL 10 MG/ML IV BOLUS
INTRAVENOUS | Status: AC
  Filled 2016-07-30: qty 20

## 2016-07-30 MED ORDER — BUPIVACAINE HCL 0.5 % IJ SOLN
INTRAMUSCULAR | Status: DC | PRN
  Administered 2016-07-30 (×2): 4 mL

## 2016-07-30 MED ORDER — BUPIVACAINE HCL (PF) 0.5 % IJ SOLN
INTRAMUSCULAR | Status: AC
  Filled 2016-07-30: qty 30

## 2016-07-30 MED ORDER — ROCURONIUM BROMIDE 100 MG/10ML IV SOLN
INTRAVENOUS | Status: DC | PRN
  Administered 2016-07-30: 35 mg via INTRAVENOUS

## 2016-07-30 MED ORDER — SCOPOLAMINE 1 MG/3DAYS TD PT72
1.0000 | MEDICATED_PATCH | Freq: Once | TRANSDERMAL | Status: DC
  Administered 2016-07-30: 1.5 mg via TRANSDERMAL

## 2016-07-30 MED ORDER — SODIUM CHLORIDE 0.9 % IV SOLN
INTRAVENOUS | Status: DC
Start: 2016-07-30 — End: 2016-07-30

## 2016-07-30 MED ORDER — LACTATED RINGERS IV SOLN
INTRAVENOUS | Status: DC
  Administered 2016-07-30 (×2): via INTRAVENOUS

## 2016-07-30 MED ORDER — KETOROLAC TROMETHAMINE 30 MG/ML IJ SOLN
INTRAMUSCULAR | Status: DC | PRN
  Administered 2016-07-30: 30 mg via INTRAVENOUS

## 2016-07-30 MED ORDER — PROPOFOL 10 MG/ML IV BOLUS
INTRAVENOUS | Status: DC | PRN
  Administered 2016-07-30: 150 mg via INTRAVENOUS

## 2016-07-30 MED ORDER — HYDROMORPHONE HCL 1 MG/ML IJ SOLN
INTRAMUSCULAR | Status: AC
  Filled 2016-07-30: qty 1

## 2016-07-30 MED ORDER — GLYCOPYRROLATE 0.2 MG/ML IJ SOLN
INTRAMUSCULAR | Status: AC
Start: 2016-07-30 — End: 2016-07-30
  Filled 2016-07-30: qty 3

## 2016-07-30 MED ORDER — DEXAMETHASONE SODIUM PHOSPHATE 10 MG/ML IJ SOLN
INTRAMUSCULAR | Status: DC | PRN
  Administered 2016-07-30: 5 mg via INTRAVENOUS

## 2016-07-30 MED ORDER — ROCURONIUM BROMIDE 100 MG/10ML IV SOLN
INTRAVENOUS | Status: AC
  Filled 2016-07-30: qty 1

## 2016-07-30 MED ORDER — NEOSTIGMINE METHYLSULFATE 10 MG/10ML IV SOLN
INTRAVENOUS | Status: AC
  Filled 2016-07-30: qty 1

## 2016-07-30 MED ORDER — OXYCODONE-ACETAMINOPHEN 5-325 MG PO TABS: 1.0000 | ORAL_TABLET | Freq: Four times a day (QID) | ORAL | 0 refills | Status: DC | PRN

## 2016-07-30 MED ORDER — KETOROLAC TROMETHAMINE 30 MG/ML IJ SOLN
INTRAMUSCULAR | Status: AC
  Filled 2016-07-30: qty 1

## 2016-07-30 SURGICAL SUPPLY — 48 items
ADH SKN CLS APL DERMABOND .7 (GAUZE/BANDAGES/DRESSINGS) ×1
APPLICATOR COTTON TIP 6IN STRL (MISCELLANEOUS) ×3 IMPLANT
BAG SPEC RTRVL LRG 6X4 10 (ENDOMECHANICALS)
BLADE SURG 15 STRL LF C SS BP (BLADE) ×1 IMPLANT
BLADE SURG 15 STRL SS (BLADE) ×3
CABLE HIGH FREQUENCY MONO STRZ (ELECTRODE) IMPLANT
CLOTH BEACON ORANGE TIMEOUT ST (SAFETY) ×3 IMPLANT
DEFOGGER SCOPE WARMER CLEARIFY (MISCELLANEOUS) ×3 IMPLANT
DERMABOND ADVANCED (GAUZE/BANDAGES/DRESSINGS) ×2
DERMABOND ADVANCED .7 DNX12 (GAUZE/BANDAGES/DRESSINGS) ×1 IMPLANT
DRSG OPSITE POSTOP 3X4 (GAUZE/BANDAGES/DRESSINGS) ×3 IMPLANT
DURAPREP 26ML APPLICATOR (WOUND CARE) ×3 IMPLANT
ELECT REM PT RETURN 9FT ADLT (ELECTROSURGICAL) ×3
ELECTRODE REM PT RTRN 9FT ADLT (ELECTROSURGICAL) ×1 IMPLANT
GLOVE BIO SURGEON STRL SZ7 (GLOVE) ×8 IMPLANT
GLOVE BIOGEL PI IND STRL 7.0 (GLOVE) ×2 IMPLANT
GLOVE BIOGEL PI IND STRL 7.5 (GLOVE) ×1 IMPLANT
GLOVE BIOGEL PI INDICATOR 7.0 (GLOVE) ×4
GLOVE BIOGEL PI INDICATOR 7.5 (GLOVE) ×2
GOWN STRL REUS W/TWL LRG LVL3 (GOWN DISPOSABLE) ×9 IMPLANT
LIGASURE VESSEL 5MM BLUNT TIP (ELECTROSURGICAL) IMPLANT
NDL INSUFF ACCESS 14 VERSASTEP (NEEDLE) IMPLANT
NS IRRIG 1000ML POUR BTL (IV SOLUTION) ×3 IMPLANT
PACK LAPAROSCOPY BASIN (CUSTOM PROCEDURE TRAY) ×3 IMPLANT
PACK TRENDGUARD 450 HYBRID PRO (MISCELLANEOUS) IMPLANT
PACK TRENDGUARD 600 HYBRD PROC (MISCELLANEOUS) IMPLANT
PAD OB MATERNITY 4.3X12.25 (PERSONAL CARE ITEMS) ×3 IMPLANT
POUCH LAPAROSCOPIC INSTRUMENT (MISCELLANEOUS) ×3 IMPLANT
POUCH SPECIMEN RETRIEVAL 10MM (ENDOMECHANICALS) IMPLANT
PROTECTOR NERVE ULNAR (MISCELLANEOUS) ×6 IMPLANT
SCISSORS LAP 5X35 DISP (ENDOMECHANICALS) IMPLANT
SET IRRIG TUBING LAPAROSCOPIC (IRRIGATION / IRRIGATOR) ×3 IMPLANT
SLEEVE ADV FIXATION 5X100MM (TROCAR) IMPLANT
SLEEVE XCEL OPT CAN 5 100 (ENDOMECHANICALS) ×3 IMPLANT
SUT MON AB 4-0 PS1 27 (SUTURE) ×2 IMPLANT
SUT VICRYL 0 UR6 27IN ABS (SUTURE) ×3 IMPLANT
SYRINGE 10CC LL (SYRINGE) ×3 IMPLANT
SYSTEM CARTER THOMASON II (TROCAR) ×2 IMPLANT
TOWEL OR 17X24 6PK STRL BLUE (TOWEL DISPOSABLE) ×6 IMPLANT
TRENDGUARD 450 HYBRID PRO PACK (MISCELLANEOUS) ×3
TRENDGUARD 600 HYBRID PROC PK (MISCELLANEOUS)
TROCAR ADV FIXATION 5X100MM (TROCAR) IMPLANT
TROCAR BALLN 12MMX100 BLUNT (TROCAR) ×3 IMPLANT
TROCAR VERSASTEP PLUS 12MM (TROCAR) IMPLANT
TROCAR VERSASTEP PLUS 5MM (TROCAR) IMPLANT
TROCAR XCEL NON-BLD 11X100MML (ENDOMECHANICALS) IMPLANT
TROCAR XCEL NON-BLD 5MMX100MML (ENDOMECHANICALS) ×3 IMPLANT
WARMER LAPAROSCOPE (MISCELLANEOUS) ×3 IMPLANT

## 2016-07-30 NOTE — Transfer of Care (Signed)
Immediate Anesthesia Transfer of Care Note  Patient: Diane [name]  Procedure(s) Performed: Procedure(s): LAPAROSCOPY DIAGNOSTIC (N/A) LAPAROSCOPIC LYSIS OF ADHESIONS (N/A)  Patient Location: PACU  Anesthesia Type:General  Level of Consciousness: awake, alert  and oriented  Airway & Oxygen Therapy: Patient connected to nasal cannula oxygen  Post-op Assessment: Report given to RN, Post -op Vital signs reviewed and stable and Patient moving all extremities X 4  Post vital signs: Reviewed and stable  Last Vitals:  Vitals:   07/30/16 0833  BP: (!) 119/95  Pulse: 84  Resp: 16  Temp: 36.7 C    Last Pain:  Vitals:   07/30/16 0833  TempSrc: Oral      Patients Stated Pain Goal: 3 (07/30/16 [id number])  Complications: No apparent anesthesia complications

## 2016-07-30 NOTE — Discharge Instructions (Signed)
Laparoscopic Surgery Discharge Instructions   You have just undergone a laparoscopic surgery.  The following list should answer your most common questions.  Although we will discuss your surgery and post-operative instructions with you prior to your discharge, this list will serve as a reminder if you fail to recall the details of what we discussed.  We will discuss your surgery once again in detail at your post-op visit in two to four weeks. If you havent already done so, please call to make your appointment as soon as possible.  How you will feel: Although you have just undergone a major surgery, your recovery will be significantly shorter since the surgery was performed through much smaller incisions than the traditional approach.  You should feel slightly better each day.  If you suddenly feel much worse than the prior day, please call the clinic.  Its important during the early part of your recovery that you maintain some activity.  Walking is encouraged.  You will quicken your recovery by continued activity.  Incision:  Your incisions will be closed with dissolvable stitches or surgical adhesive (glue).  There may be Band-aids and/or Steri-strips covering your incisions.  If there is no drainage from the incisions you may remove the Band-aids in one to two days.  You may notice some minor bruising at the incision sites.  This is common and will resolve within several days.  Please inform us if the redness at the edges of your incision appears to be spreading.  If the skin around your incision becomes warm to the touch, or if you notice a pus-like drainage, please call the office.   Stairs/Driving/Activities: You may climb stairs if necessary.  If youve had general anesthesia, do not drive a car the rest of the day today.  You may begin light housework when you feel up to it, but avoid heavy lifting (more than 15-20lbs) or pushing until cleared for these activities by your physician.  Hygiene:   Do not soak your incisions.  Showers are acceptable but you may not take a bath or swim in a pool.  Cleanse your incisions daily with soap and water.  Medications:  Please resume taking any medications that you were taking prior to the surgery.  If we have prescribed any new medications for you, please take them as directed.  Constipation:  It is fairly common to experience some difficulty in moving your bowels following major surgery.  Being active will help to reduce this likelihood. A diet rich in fiber and plenty of liquids is desirable.  If you do become constipated, a mild laxative such as Miralax, Milk of Magnesia, or Metamucil, or a stool softener such as Colace, is recommended.  No lifting more than 15 lbs until you are seen for your post operative appointment in approximately one month.   General Instructions: If you develop a fever of 100.5 degrees or higher, please call the office number(s) below for physician on call.     Post Anesthesia Home Care Instructions  Activity: Get plenty of rest for the remainder of the day. A responsible adult should stay with you for 24 hours following the procedure.  For the next 24 hours, DO NOT: -Drive a car -Advertising copywriterperate machinery -Drink alcoholic beverages -Take any medication unless instructed by your physician -Make any legal decisions or sign important papers.  Meals: Start with liquid foods such as gelatin or soup. Progress to regular foods as tolerated. Avoid greasy, spicy, heavy foods. If nausea and/or vomiting  occur, drink only clear liquids until the nausea and/or vomiting subsides. Call your physician if vomiting continues.  Special Instructions/Symptoms: Your throat may feel dry or sore from the anesthesia or the breathing tube placed in your throat during surgery. If this causes discomfort, gargle with warm salt water. The discomfort should disappear within 24 hours.  If you had a scopolamine patch placed behind your ear for the  management of post- operative nausea and/or vomiting:  1. The medication in the patch is effective for 72 hours, after which it should be removed.  Wrap patch in a tissue and discard in the trash. Wash hands thoroughly with soap and water. 2. You may remove the patch earlier than 72 hours if you experience unpleasant side effects which may include dry mouth, dizziness or visual disturbances. 3. Avoid touching the patch. Wash your hands with soap and water after contact with the patch.    Post Anesthesia Home Care Instructions  Activity: Get plenty of rest for the remainder of the day. A responsible adult should stay with you for 24 hours following the procedure.  For the next 24 hours, DO NOT: -Drive a car -Advertising copywriterperate machinery -Drink alcoholic beverages -Take any medication unless instructed by your physician -Make any legal decisions or sign important papers.  Meals: Start with liquid foods such as gelatin or soup. Progress to regular foods as tolerated. Avoid greasy, spicy, heavy foods. If nausea and/or vomiting occur, drink only clear liquids until the nausea and/or vomiting subsides. Call your physician if vomiting continues.  Special Instructions/Symptoms: Your throat may feel dry or sore from the anesthesia or the breathing tube placed in your throat during surgery. If this causes discomfort, gargle with warm salt water. The discomfort should disappear within 24 hours.  If you had a scopolamine patch placed behind your ear for the management of post- operative nausea and/or vomiting:  1. The medication in the patch is effective for 72 hours, after which it should be removed.  Wrap patch in a tissue and discard in the trash. Wash hands thoroughly with soap and water. 2. You may remove the patch earlier than 72 hours if you experience unpleasant side effects which may include dry mouth, dizziness or visual disturbances. 3. Avoid touching the patch. Wash your hands with soap and water  after contact with the patch.    NO IBUPROFEN PRODUCTS (MOTRIN, ADVIL) OR ALEVE UNTIL 4:30PM TODAY.

## 2016-07-30 NOTE — Anesthesia Preprocedure Evaluation (Addendum)
Anesthesia Evaluation  Patient identified by MRN, date of birth, ID band Patient awake    Reviewed: Allergy & Precautions, NPO status , Patient's Chart, lab work & pertinent test results  History of Anesthesia Complications Negative for: history of anesthetic complications  Airway Mallampati: II  TM Distance: >3 FB Neck ROM: Full    Dental no notable dental hx. (+) Dental Advisory Given   Pulmonary neg pulmonary ROS,    Pulmonary exam normal        Cardiovascular negative cardio ROS Normal cardiovascular exam     Neuro/Psych  Headaches, negative psych ROS   GI/Hepatic negative GI ROS, Neg liver ROS,   Endo/Other  negative endocrine ROS  Renal/GU negative Renal ROS     Musculoskeletal negative musculoskeletal ROS (+)   Abdominal   Peds  Hematology negative hematology ROS (+)   Anesthesia Other Findings Day of surgery medications reviewed with the patient.  Reproductive/Obstetrics                            Anesthesia Physical Anesthesia Plan  ASA: II  Anesthesia Plan: General   Post-op Pain Management:    Induction: Intravenous  Airway Management Planned: Oral ETT  Additional Equipment:   Intra-op Plan:   Post-operative Plan: Extubation in OR  Informed Consent: I have reviewed the patients History and Physical, chart, labs and discussed the procedure including the risks, benefits and alternatives for the proposed anesthesia with the patient or authorized representative who has indicated his/her understanding and acceptance.   Dental advisory given  Plan Discussed with: CRNA and Anesthesiologist  Anesthesia Plan Comments:        Anesthesia Quick Evaluation

## 2016-07-30 NOTE — Op Note (Addendum)
Operative Note   07/30/2016  PRE-OP DIAGNOSIS *Chronic pelvic pain *Dysmenorrhea  *History of STIs   POST-OP DIAGNOSIS *Same *Mild pelvic adhesive disease  SURGEON: Surgeon(s) and Role:    * [place] Bingharlie Mariea Mcmartin, MD - Primary  ASSISTANT: None  PROCEDURE: Procedure(s): LAPAROSCOPY DIAGNOSTIC LAPAROSCOPIC LYSIS OF ADHESIONS <45 minutes  ANESTHESIA: General and local  ESTIMATED BLOOD LOSS: 5mL  DRAINS: indwelling foley (50mL UOP)   TOTAL IV FLUIDS: 1200mL crystalloid  VTE PROPHYLAXIS: SCDs to the bilateral lower extremities  ANTIBIOTICS: not indicated  SPECIMENS: none  DISPOSITION: PACU - hemodynamically stable.  CONDITION: stable  COMPLICATIONS: None  FINDINGS: Normal liver edge, stomach edge, omentum, appendix, uterus, ovaries and fallopian tubes. Normal EGBUS and vaginal vault and cervix. In the posterior cul de sac and in the right paraovarian fossa there were filmy adhesions and "rind" consistent with prior inflammation. These were easily lysed via blunt dissection.   DESCRIPTION OF PROCEDURE: After informed consent was obtained, the patient was taken to the operating room where anesthesia was obtained without difficulty. The patient was positioned in the dorsal lithotomy position in [place] stirrups and her arms were carefully tucked at her sides and the usual precautions were taken.  She was prepped and draped in normal sterile fashion.  Time-out was performed and a Foley catheter was placed into the bladder. A standard Hulka uterine manipulator was then placed in the uterus without incident. Gloves were then changed, and after injection of local anesthesia, the open technique was used to place an infraumbilical 12-mm baloon trocar under direct visualization. The laparoscope was introduced and CO2 gas was infused for pneumoperitoneum to a pressure of 15 mm Hg and the area below inspected for injury.  The patient was placed in Trendelenburg and the bowel was displaced up into  the upper abdomen and a suprapubic 5-mm port was placed under direct visualization of the laparoscope, after injection of local anesthesia.  See above for findings. The gas was then dropped to 6mmHg and hemostasis noted from the LOA sites.  The umbilical port was removed after placement of the Carter-Thomason 2 device and the introducer used at the guide insert site and 0 vicryl placed. The introducer was then used to regrab the suture on the opposite side. The suprapubic port was then removed and the gas released from the abdomen and the suture at the umbilical fascia site was tied and closure of the fascia was palpated. The skin incision at the umbilicus was closed with a subcuticular stitch of 4-0 monocryl.  This and the suprapubic port were closed with dermabond glue.  The hulka was then removed and silver nitrate applied at the [place] site.   The patient tolerated the procedure well.  Sponge, lap and needle counts were correct x2.  The patient was taken to recovery room in excellent condition.  Cornelia Copaharlie Case Vassell, Jr MD Attending [facility](Faculty Practice)

## 2016-07-30 NOTE — H&P (Signed)
Obstetrics & Gynecology Pre Op H&P   Date of Surgery: 07/30/2016   [facility] Primary Care Provider: No PCP Per Patient   History of Present Illness: Ms. Diane HockeyRobinson is a 25 y.o. [phone] (Patient's last menstrual period was 06/20/2016 (approximate).), with the above CC. PMHx is significant for BMI 23, remote h/o STIs.    Interval History:  No changes since 11/13 visit  History of Present Illness: Diane [name] is a 25 y.o. African-American [id number], seen for the above chief complaint. Her past medical history is significant for h/o dysmenorrhea, BMI 23, remote h/o STIs  At her 11/13 visit she was having weekly pain and was worse around menses  At her 6/19 last visit, her pap smear was negative and plans made for NSAIDs around the time of menses and to do continuous OCPs. LMP early November and prior to this was back in August. She states her pain is worse during her periods and that her periods are heavy and painful and last for aobut 7 days. She states she also has pains about every week that last for a few minutes that feels like labor pains; she also states that she has some pain with intercourse.  She went to [facility]U when she had her period in early November and was just advised NSAIDs; UPT, u/a negative  She denies any blood in her BMs, dysuria, hematuria.   Patient went to [facility]U for dysmenorrhea and slightly heavy VB during her period on 5/24. [facility] and has been on continuous OCPs for the past 6 months or so and was on her scheduled 8474m withdrawal bleed when she came into [facility]U. She had occasional BTB with the first 1874m but is on a different type of OCP for continuous (name unknown) and hasn't had any issues since her LMP. No real pain or AUB outside of her periods. [facility] and negative and negative u/a and UPT in the [facility]U. She had an u/s done in 01/2016 was negative with RV 9 x 4 x 5cm uterus, RV, ES 9mm  and normal ovaries and no FF in the pelvis.   Last pap unknown. Patient does states she's had a h/o abnormal pap with colpo needed  H/o depo use x 2 years but stopped due to weight loss Nexplanon x 7152m but stopped due to AUB.   ROS: A 12-point review of systems was performed and negative, except as stated in the above HPI.  OBGYN History: As per HPI. OB History  Gravida Para Term Preterm AB Living  2 2 2     2   SAB TAB Ectopic Multiple Live Births               # Outcome Date GA Lbr Len/2nd Weight Sex Delivery Anes PTL Lv  2 Term           1 Term             SVD x 2   Past Medical History: Past Medical History:  Diagnosis Date  . Anemia   . Dyspnea    at rest and exertion  . Headache    Migraines  . Medical history non-contributory     Past Surgical History: Past Surgical History:  Procedure Laterality Date  . NO PAST SURGERIES    . WISDOM TOOTH EXTRACTION      Family History:  History reviewed. No pertinent family history.  Social History:  Social History   Social History  . Marital status: Single    Spouse name: N/A  . Number of children: N/A  . Years of education: N/A   Occupational History  . Not on file.   Social History Main Topics  . Smoking status: Never Smoker  . Smokeless tobacco: Never Used  . Alcohol use No  . Drug use: No  . Sexual activity: Yes    Birth control/ protection: Pill   Other Topics Concern  . Not on file   Social History Narrative  . No narrative on file    Allergy: No Known Allergies  Current Outpatient Medications: Prescriptions Prior to Admission  Medication Sig Dispense Refill Last Dose  . Levonorgestrel-Ethinyl Estradiol (ASHLYNA) 0.15-0.03 &0.01 MG tablet Take 1 tablet by mouth daily.   07/29/2016 at Unknown time  . naproxen (NAPROSYN) 500 MG tablet Take 1 tablet (500 mg total) by mouth 2 (two) times daily with a meal. (Patient taking differently: Take 500 mg by mouth 2 (two) times daily as needed for  moderate pain. ) 60 tablet 1 More than a month at Unknown time     Hospital Medications: Current Facility-Administered Medications  Medication Dose Route Frequency Provider Last Rate Last Dose  . 0.9 %  sodium chloride infusion   Intravenous Continuous [place] Bing, MD      . lactated ringers infusion   Intravenous Continuous Heather Roberts, MD 125 mL/hr at 07/30/16 [phone]    . scopolamine (TRANSDERM-SCOP) 1 MG/3DAYS 1.5 mg  1 patch Transdermal Once Heather Roberts, MD   1.5 mg at 07/30/16 0841     Physical Exam:  Current Vital Signs 24h Vital Sign Ranges  T 98 F (36.7 C) Temp  Avg: 98 F (36.7 C)  Min: 98 F (36.7 C)  Max: 98 F (36.7 C)  BP (!) 119/95 BP  Min: 119/95  Max: 119/95  HR 84 Pulse  Avg: 84  Min: 84  Max: 84  RR 16 Resp  Avg: 16  Min: 16  Max: 16  SaO2 100 % Not Delivered SpO2  Avg: 100 %  Min: 100 %  Max: 100 %       24 Hour I/O Current Shift I/O  Time Ins Outs No intake/output data recorded. No intake/output data recorded.   General appearance: Well nourished, well developed female in no acute distress.  Neck:  Supple, normal appearance, and no thyromegaly  Cardiovascular: S1, S2 normal, no murmur, rub or gallop, regular rate and rhythm Respiratory:  Clear to auscultation bilateral. Normal respiratory effort Abdomen:  no masses, hernias; diffusely non tender to palpation, non distended Neuro/Psych:  Normal mood and affect.  Skin:  Warm and dry.  Lymphatic:  No inguinal lymphadenopathy.   From 6/19 Pelvic exam: is notlimited by body habitus EGBUS: within normal limits Vagina: within normal limits and with no blood in the vault, Cervix: no lesions or cervical motion tenderness Uterus: nonenlargedand approximately 8week sized Adnexa: normal adnexa and no mass, fullness, tenderness  Laboratory: UPT negative  Recent Labs Lab 07/26/16 1540  WBC 9.0  HGB 13.2  HCT 37.8  PLT 305    Recent Labs Lab 07/26/16 1540  ABORH B POS  B POS    Imaging:   CLINICAL DATA:  25 year old female with chronic bilateral pelvic pain and cramping. LMP 01/01/2016.  EXAM: TRANSABDOMINAL AND TRANSVAGINAL ULTRASOUND OF PELVIS  TECHNIQUE: Both transabdominal and transvaginal ultrasound examinations of the pelvis were performed. Transabdominal technique was performed for global imaging of the  pelvis including uterus, ovaries, adnexal regions, and pelvic cul-de-sac. It was necessary to proceed with endovaginal exam following the transabdominal exam to visualize the endometrium and adnexal.  COMPARISON:  No prior non obstetric pelvic sonogram.  FINDINGS: Uterus  Measurements: 9.1 x 4.0 x 4.8 cm. The uterus is retroverted and retroflexed and normal in size and configuration. No uterine fibroids or other myometrial abnormality.  Endometrium  Thickness: 9 mm. Trace fluid in the fundal endometrial cavity. No focal endometrial mass demonstrated.  Right ovary  Measurements: 3.5 x 2.4 x 1.8 cm. Normal appearance/no adnexal mass.  Left ovary  Measurements: 2.3 x 1.6 x 2.0 cm. Normal appearance/no adnexal mass.  Other findings  No abnormal free fluid.  IMPRESSION: 1. Normal retroverted uterus.  No uterine fibroids. 2. Trace fluid in the fundal endometrial cavity. No focal endometrial mass. Bilayer endometrial thickness 9 mm, within normal limits. 3. Normal ovaries.  No adnexal masses.   Electronically Signed   By: Delbert PhenixJason A Poff M.D.   On: 01/27/2016 14:52  Assessment: Ms. Diane HockeyRobinson is a 25 y.o. [phone] (Patient's last menstrual period was 06/20/2016 (approximate).) with chronic pelvic pain; pt stable  Plan: D/w her re: l/s approach and possibility of open procedure and she is amenable with proceeding. Can proceed when the OR is ready.   Cornelia Copaharlie Jakhai Fant, Jr. MD Attending Center [facility](Faculty Practice)

## 2016-07-30 NOTE — Telephone Encounter (Signed)
Telephone Note I called the patient to go over the surgery b/c I couldn't find her boyfriend after the surgery to go over the operative findings. Pt doing well, just tired. Operative findings reviewed  Cornelia Copaharlie Cherylin Waguespack, Jr MD Attending [facility] (Faculty Practice) 07/30/2016 Time: [id number]

## 2016-07-30 NOTE — Anesthesia Postprocedure Evaluation (Signed)
Anesthesia Post Note  Patient: Darryll CapersBrittney M Culley  Procedure(s) Performed: Procedure(s) (LRB): LAPAROSCOPY DIAGNOSTIC (N/A) LAPAROSCOPIC LYSIS OF ADHESIONS (N/A)  Patient location during evaluation: PACU Anesthesia Type: General Level of consciousness: sedated Pain management: pain level controlled Vital Signs Assessment: post-procedure vital signs reviewed and stable Respiratory status: spontaneous breathing and respiratory function stable Cardiovascular status: stable Anesthetic complications: no     Last Vitals:  Vitals:   07/30/16 1125 07/30/16 1155  BP:  (P) 111/73  Pulse: 70 (P) 88  Resp: 10 (P) 16  Temp:      Last Pain:  Vitals:   07/30/16 0833  TempSrc: Oral   Pain Goal: Patients Stated Pain Goal: 3 (07/30/16 0833)               Heather RobertsSINGER,Jaqlyn Gruenhagen DANIEL

## 2016-07-30 NOTE — Anesthesia Procedure Notes (Signed)
Procedure Name: Intubation Date/Time: 07/30/2016 9:46 AM Performed by: Heather RobertsSINGER, JAMES Pre-anesthesia Checklist: Patient identified, Emergency Drugs available, Suction available and Patient being monitored Patient Re-evaluated:Patient Re-evaluated prior to inductionOxygen Delivery Method: Circle system utilized Preoxygenation: Pre-oxygenation with 100% oxygen Intubation Type: IV induction Ventilation: Mask ventilation without difficulty Laryngoscope Size: Miller and 2 Grade View: Grade I Tube type: Oral Tube size: 7.0 mm Number of attempts: 1 Airway Equipment and Method: Stylet Placement Confirmation: ETT inserted through vocal cords under direct vision,  positive ETCO2 and breath sounds checked- equal and bilateral Secured at: 23 cm Tube secured with: Tape Dental Injury: Teeth and Oropharynx as per pre-operative assessment

## 2016-08-01 ENCOUNTER — Encounter (HOSPITAL_COMMUNITY): Payer: Self-pay | Admitting: Obstetrics and Gynecology

## 2016-09-13 ENCOUNTER — Ambulatory Visit (INDEPENDENT_AMBULATORY_CARE_PROVIDER_SITE_OTHER): Payer: Self-pay | Admitting: Obstetrics and Gynecology

## 2016-09-13 ENCOUNTER — Encounter: Payer: Self-pay | Admitting: Obstetrics and Gynecology

## 2016-09-13 DIAGNOSIS — N941 Unspecified dyspareunia: Secondary | ICD-10-CM

## 2016-09-13 NOTE — Progress Notes (Signed)
Obstetrics and Gynecology Visit Post Op Evaluation  Appointment Date: 09/13/2016  [facility]  Primary Care Provider: No PCP Per Patient  Chief Complaint:  Chief Complaint  Patient presents with  . Routine Post Op    History of Present Illness: Philippa ChesterBrittney Marijean HeathM Noll is a 26 y.o. African-American [id number] s/p 12/18 diagnostic l/s and LOA<45 for dysmenorrhea, dyspareunia. She was discharged to home from the PACU. Operative findings were:  Normal liver edge, stomach edge, omentum, appendix, uterus, ovaries and fallopian tubes. Normal EGBUS and vaginal vault and cervix. In the posterior cul de sac and in the right paraovarian fossa there were filmy adhesions and "rind" consistent with prior inflammation. These were easily lysed via blunt dissection.   Has some dyspareunia still. No other issues  Review of Systems:  as noted in the History of Present Illness.  No change in medical history  Physical Exam:  BP 110/69   Pulse 78   Wt 141 lb 14.4 oz (64.4 kg)   LMP 06/13/2016 (Approximate)   BMI 22.90 kg/m  Body mass index is 22.9 kg/m. General appearance: Well nourished, well developed female in no acute distress.  Abdomen: soft, nttp, c/d/i and well healed l/s sites x 2 Neuro/Psych:  Normal mood and affect.  Skin:  Warm and dry.   Laboratory: none  Radiology: none  Assessment: patient stable  Plan:  D/w her re: operative findings and next step and I told her that likely sequelae from prior trichomoniasis infection. I told her that s/s may get better, stay the same or improve and then regress but that likely would need consideration for hyst in the far future. I also d/w her going back on depo, changing the OCP type vs keeping same OCP management (continuous pill use) and she'd like to do the latter. Pt told to call us with any questions in the future.   RTC PRN  Cornelia Copaharlie Kebron Pulse, Jr MD Attending [facility](Faculty Practice)

## 2017-05-01 ENCOUNTER — Inpatient Hospital Stay (HOSPITAL_COMMUNITY)
Admission: AD | Admit: 2017-05-01 | Discharge: 2017-05-01 | Disposition: A | Discharge status: Home / Self Care | Payer: Medicaid Other | Source: Ambulatory Visit | Attending: Obstetrics & Gynecology | Admitting: Obstetrics & Gynecology

## 2017-05-01 ENCOUNTER — Encounter (HOSPITAL_COMMUNITY): Payer: Self-pay | Admitting: *Deleted

## 2017-05-01 DIAGNOSIS — Z9889 Other specified postprocedural states: Secondary | ICD-10-CM | POA: Insufficient documentation

## 2017-05-01 DIAGNOSIS — O26899 Other specified pregnancy related conditions, unspecified trimester: Secondary | ICD-10-CM

## 2017-05-01 DIAGNOSIS — Z79899 Other long term (current) drug therapy: Secondary | ICD-10-CM | POA: Diagnosis not present

## 2017-05-01 DIAGNOSIS — R109 Unspecified abdominal pain: Secondary | ICD-10-CM | POA: Insufficient documentation

## 2017-05-01 DIAGNOSIS — O26891 Other specified pregnancy related conditions, first trimester: Secondary | ICD-10-CM | POA: Insufficient documentation

## 2017-05-01 DIAGNOSIS — Z3A09 9 weeks gestation of pregnancy: Secondary | ICD-10-CM | POA: Insufficient documentation

## 2017-05-01 DIAGNOSIS — R102 Pelvic and perineal pain: Secondary | ICD-10-CM

## 2017-05-01 LAB — WET PREP, GENITAL
Clue Cells Wet Prep HPF POC: NONE SEEN
SPERM: NONE SEEN
Trich, Wet Prep: NONE SEEN
YEAST WET PREP: NONE SEEN

## 2017-05-01 LAB — URINALYSIS, ROUTINE W REFLEX MICROSCOPIC
BILIRUBIN URINE: NEGATIVE
GLUCOSE, UA: NEGATIVE mg/dL
Hgb urine dipstick: NEGATIVE
Ketones, ur: NEGATIVE mg/dL
Nitrite: NEGATIVE
PH: 6 (ref 5.0–8.0)
Protein, ur: NEGATIVE mg/dL
SPECIFIC GRAVITY, URINE: 1.014 (ref 1.005–1.030)

## 2017-05-01 LAB — POCT PREGNANCY, URINE: Preg Test, Ur: POSITIVE — AB

## 2017-05-01 MED ORDER — PRENATAL PLUS 27-1 MG PO TABS: 1.0000 | ORAL_TABLET | Freq: Every day | ORAL | 0 refills | Status: DC

## 2017-05-01 MED ORDER — PROMETHAZINE HCL 12.5 MG RE SUPP: 12.5000 mg | Freq: Four times a day (QID) | RECTAL | 0 refills | Status: DC | PRN

## 2017-05-01 NOTE — MAU Provider Note (Signed)
History     CSN: [id number]  Arrival date and time: 05/01/17 1615   None     Chief Complaint  Patient presents with  . Abdominal Pain   HPI Diane [name] is a 26 y/o female who presents for evaluation of moderate, intermittent right and left sided abdominal pain x 2 weeks. Patient describes pain as "period cramping". Patient states pain switches sides from her left suprapubic region to her right suprapubic region. Currently she is having pain on the right. This pain occurs at random and nothing seems to make it better or worse. She has not taken anything. No other complaints at this time. Patient [facility].   OB History    Gravida Para Term Preterm AB Living   SAB TAB Ectopic Multiple Live Births                  Obstetric Comments   SVD x 2      Past Medical History:  Diagnosis Date  . Anemia   . Headache    Migraines  . Medical history non-contributory     Past Surgical History:  Procedure Laterality Date  . LAPAROSCOPIC LYSIS OF ADHESIONS N/A 07/30/2016   Procedure: LAPAROSCOPIC LYSIS OF ADHESIONS;  Surgeon: [place] Bing, MD;  Location: [facility];  Service: Gynecology;  Laterality: N/A;  . LAPAROSCOPY N/A 07/30/2016   Procedure: LAPAROSCOPY DIAGNOSTIC;  Surgeon: [place] Bing, MD;  Location: [facility];  Service: Gynecology;  Laterality: N/A;  . NO PAST SURGERIES    . WISDOM TOOTH EXTRACTION      History reviewed. No pertinent family history.  Social History  Substance Use Topics  . Smoking status: Never Smoker  . Smokeless tobacco: Never Used  . Alcohol use No    Allergies: No Known Allergies  Prescriptions Prior to Admission  Medication Sig Dispense Refill Last Dose  . docusate sodium (COLACE) 100 MG capsule Take 1 capsule (100 mg total) by mouth 2 (two) times daily. (Patient not taking: Reported on 09/13/2016) 30 capsule 0 Not Taking  . Levonorgestrel-Ethinyl Estradiol (ASHLYNA) 0.15-0.03  &0.01 MG tablet Take 1 tablet by mouth daily.   Taking  . naproxen (NAPROSYN) 500 MG tablet Take 1 tablet (500 mg total) by mouth 2 (two) times daily with a meal. (Patient taking differently: Take 500 mg by mouth 2 (two) times daily as needed for moderate pain. ) 60 tablet 1 Taking  . oxyCODONE-acetaminophen (ROXICET) 5-325 MG tablet Take 1 tablet by mouth every 6 (six) hours as needed for severe pain. (Patient not taking: Reported on 09/13/2016) 5 tablet 0 Not Taking    Review of Systems  Gastrointestinal: Positive for abdominal pain and nausea. Negative for constipation, diarrhea and vomiting.  Genitourinary: Negative for difficulty urinating, dysuria, flank pain, vaginal bleeding, vaginal discharge and vaginal pain.  Neurological: Positive for headaches.   Physical Exam   Blood pressure 120/76, pulse 87, temperature 98.6 F (37 C), temperature source Oral, resp. rate 18, weight 70.4 kg (155 lb 4 oz), last menstrual period 02/23/2017, SpO2 100 %.  Physical Exam  Constitutional: She is oriented to person, place, and time. She appears well-developed and well-nourished. No distress.  HENT:  Head: Normocephalic and atraumatic.  Cardiovascular: Normal rate, regular rhythm and normal heart sounds.  Exam reveals no gallop and no friction rub.   No murmur heard. Respiratory: Effort normal and breath sounds normal. No respiratory distress. She  has no wheezes. She has no rales.  GI: Soft. Bowel sounds are normal. She exhibits no distension. There is no tenderness. There is no rebound and no guarding.  Neurological: She is alert and oriented to person, place, and time.  Skin: Skin is warm and dry.  Psychiatric: She has a normal mood and affect.    MAU Course  Procedures Results for orders placed or performed during the hospital encounter of 05/01/17 (from the past 24 hour(s))  Urinalysis, Routine w reflex microscopic     Status: Abnormal   Collection Time: 05/01/17  4:27 PM  Result Value Ref  Range   Color, Urine YELLOW YELLOW   APPearance HAZY (A) CLEAR   Specific Gravity, Urine 1.014 1.005 - 1.030   pH 6.0 5.0 - 8.0   Glucose, UA NEGATIVE NEGATIVE mg/dL   Hgb urine dipstick NEGATIVE NEGATIVE   Bilirubin Urine NEGATIVE NEGATIVE   Ketones, ur NEGATIVE NEGATIVE mg/dL   Protein, ur NEGATIVE NEGATIVE mg/dL   Nitrite NEGATIVE NEGATIVE   Leukocytes, UA TRACE (A) NEGATIVE   RBC / HPF 0-5 0 - 5 RBC/hpf   WBC, UA 0-5 0 - 5 WBC/hpf   Bacteria, UA RARE (A) NONE SEEN   Squamous Epithelial / LPF 0-5 (A) NONE SEEN   Mucus PRESENT   Pregnancy, urine POC     Status: Abnormal   Collection Time: 05/01/17  4:38 PM  Result Value Ref Range   Preg Test, Ur POSITIVE (A) NEGATIVE    MDM Bedside ultrasound by Deridre Montague Corella, CNM> IUP, normal FHR 150's   Assessment and Plan  1. Abdominal pain in pregnancy   Tylenol as needed for pain  Follow up with prenatal care at [facility]ol[facility] PA-S 05/01/2017, 5:04 PM  [facility] studies   CNM attestation:  I have seen and examined this patient and agree with above documentation in the PA-S note.   [name] is a 26 y.o. G72P2002 female with 2 week hx lower abdominal cramping.  ROS as above  PE: Patient Vitals for the past 24 hrs:  BP Temp Temp src Pulse Resp SpO2 Weight  05/01/17 1625 120/76 98.6 F (37 C) Oral 87 18 100 % 155 lb 4 oz (70.4 kg)   Gen: NAD Resp: normal effort Heart: Regular rate Abd: Soft, NT, no groin tenderness Back: neg CVAT Neuro: A&O  Pelvic exam:  NEFG, Physiologic/moderate amount mucusy and thick white discharge     Bimanual: Cx L/C, Uterus 8-10wks size, no adnexal tenderness or masses  Results for orders placed or performed during the hospital encounter of 05/01/17 (from the past 24 hour(s))  Urinalysis, Routine w reflex microscopic     Status: Abnormal   Collection Time: 05/01/17  4:27 PM  Result Value Ref Range   Color, Urine YELLOW YELLOW   APPearance HAZY (A)  CLEAR   Specific Gravity, Urine 1.014 1.005 - 1.030   pH 6.0 5.0 - 8.0   Glucose, UA NEGATIVE NEGATIVE mg/dL   Hgb urine dipstick NEGATIVE NEGATIVE   Bilirubin Urine NEGATIVE NEGATIVE   Ketones, ur NEGATIVE NEGATIVE mg/dL   Protein, ur NEGATIVE NEGATIVE mg/dL   Nitrite NEGATIVE NEGATIVE   Leukocytes, UA TRACE (A) NEGATIVE   RBC / HPF 0-5 0 - 5 RBC/hpf   WBC, UA 0-5 0 - 5 WBC/hpf   Bacteria, UA RARE (A) NONE SEEN   Squamous Epithelial / LPF 0-5 (A) NONE SEEN   Mucus PRESENT  Pregnancy, urine POC     Status: Abnormal   Collection Time: 05/01/17  4:38 PM  Result Value Ref Range   Preg Test, Ur POSITIVE (A) NEGATIVE  Wet prep, genital     Status: Abnormal   Collection Time: 05/01/17  5:20 PM  Result Value Ref Range   Yeast Wet Prep HPF POC NONE SEEN NONE SEEN   Trich, Wet Prep NONE SEEN NONE SEEN   Clue Cells Wet Prep HPF POC NONE SEEN NONE SEEN   WBC, Wet Prep HPF POC MODERATE (A) NONE SEEN   Sperm NONE SEEN    GC/CT sent  Assessment G3P2002 at [redacted]w[redacted]d with viable IUP  1. Abdominal pain during pregnancy in first trimester   2. Pain of round ligament affecting pregnancy, antepartum     Plan: Discharge home in stable condition with reassurance Allergies as of 05/01/2017   No Known Allergies     Medication List    STOP taking these medications   ASHLYNA 0.15-0.03 &0.01 MG tablet Generic drug:  Levonorgestrel-Ethinyl Estradiol   docusate sodium 100 MG capsule Commonly known as:  COLACE   naproxen 500 MG tablet Commonly known as:  NAPROSYN   oxyCODONE-acetaminophen 5-325 MG tablet Commonly known as:  ROXICET     TAKE these medications   prenatal vitamin w/FE, FA 27-1 MG Tabs tablet Take 1 tablet by mouth daily.            Discharge Care Instructions        Start     Ordered   05/01/17 0000  prenatal vitamin w/FE, FA (PRENATAL 1 + 1) 27-1 MG TABS tablet  Daily    Question:  Supervising Provider  Answer:  Tereso Newcomer   05/01/17 1725     Follow-up  Information    [facility]. Schedule an appointment as soon as possible for a visit in 1 week(s).   Contact information: [address] [place] [address] [phone]         Earley Favor 05/01/2017 5:32 PM

## 2017-05-01 NOTE — MAU Note (Signed)
Keep having cramping, mostly on rt side.

## 2017-05-01 NOTE — MAU Note (Signed)
Pain for the last 2 weeks, no bleeding.  Has appt with [facility] in October.

## 2017-05-01 NOTE — Discharge Instructions (Signed)

## 2017-05-02 LAB — GC/CHLAMYDIA PROBE AMP (~~LOC~~) NOT AT ARMC
Chlamydia: NEGATIVE
Neisseria Gonorrhea: NEGATIVE

## 2017-05-13 DIAGNOSIS — Z3490 Encounter for supervision of normal pregnancy, unspecified, unspecified trimester: Secondary | ICD-10-CM | POA: Insufficient documentation

## 2017-05-14 ENCOUNTER — Ambulatory Visit (INDEPENDENT_AMBULATORY_CARE_PROVIDER_SITE_OTHER): Payer: Medicaid Other | Admitting: Certified Nurse Midwife

## 2017-05-14 ENCOUNTER — Other Ambulatory Visit (HOSPITAL_COMMUNITY)
Admission: RE | Admit: 2017-05-14 | Discharge: 2017-05-14 | Disposition: A | Discharge status: Home / Self Care | Payer: Medicaid Other | Source: Ambulatory Visit | Attending: Certified Nurse Midwife | Admitting: Certified Nurse Midwife

## 2017-05-14 ENCOUNTER — Encounter: Payer: Self-pay | Admitting: Certified Nurse Midwife

## 2017-05-14 DIAGNOSIS — Z3490 Encounter for supervision of normal pregnancy, unspecified, unspecified trimester: Secondary | ICD-10-CM

## 2017-05-14 DIAGNOSIS — Z3481 Encounter for supervision of other normal pregnancy, first trimester: Secondary | ICD-10-CM

## 2017-05-14 DIAGNOSIS — Z3491 Encounter for supervision of normal pregnancy, unspecified, first trimester: Secondary | ICD-10-CM | POA: Insufficient documentation

## 2017-05-14 DIAGNOSIS — Z3A11 11 weeks gestation of pregnancy: Secondary | ICD-10-CM | POA: Diagnosis not present

## 2017-05-14 DIAGNOSIS — Z3687 Encounter for antenatal screening for uncertain dates: Secondary | ICD-10-CM

## 2017-05-14 NOTE — Progress Notes (Signed)
Subjective:    Diane [name] is being seen today for her first obstetrical visit.  This is a planned pregnancy. She is at [redacted]w[redacted]d gestation. Her obstetrical history is significant for none, recent dx abdominal lap for dysperiunia and dysmenorrhea with adhesions. Relationship with FOB: significant other, living together. Patient does intend to breast feed. Pregnancy history fully reviewed.  History of migraines.  Works at [employer] as a Conservation officer, nature.   The information documented in the HPI was reviewed and verified.  Menstrual History: OB History    Gravida Para Term Preterm AB Living   SAB TAB Ectopic Multiple Live Births           2      Obstetric Comments   SVD x 2       Patient's last menstrual period was 02/23/2017.    Past Medical History:  Diagnosis Date  . Anemia   . Headache    Migraines  . Medical history non-contributory     Past Surgical History:  Procedure Laterality Date  . LAPAROSCOPIC LYSIS OF ADHESIONS N/A 07/30/2016   Procedure: LAPAROSCOPIC LYSIS OF ADHESIONS;  Surgeon: [place] Bing, MD;  Location: [facility];  Service: Gynecology;  Laterality: N/A;  . LAPAROSCOPY N/A 07/30/2016   Procedure: LAPAROSCOPY DIAGNOSTIC;  Surgeon: [place] Bing, MD;  Location: [facility];  Service: Gynecology;  Laterality: N/A;  . NO PAST SURGERIES    . WISDOM TOOTH EXTRACTION       (Not in a hospital admission) No Known Allergies  Social History  Substance Use Topics  . Smoking status: Never Smoker  . Smokeless tobacco: Never Used  . Alcohol use No    History reviewed. No pertinent family history.   Review of Systems Constitutional: negative for weight loss Gastrointestinal: negative for vomiting Genitourinary:negative for genital lesions and vaginal discharge and dysuria Musculoskeletal:negative for back pain Behavioral/Psych: negative for abusive relationship, depression, illegal drug usage and tobacco use    Objective:    LMP 02/23/2017  General  Appearance:    Alert, cooperative, no distress, appears stated age  Head:    Normocephalic, without obvious abnormality, atraumatic  Eyes:    PERRL, conjunctiva/corneas clear, EOM's intact, fundi    benign, both eyes  Ears:    Normal TM's and external ear canals, both ears  Nose:   Nares normal, septum midline, mucosa normal, no drainage    or sinus tenderness  Throat:   Lips, mucosa, and tongue normal; teeth and gums normal  Neck:   Supple, symmetrical, trachea midline, no adenopathy;    thyroid:  no enlargement/tenderness/nodules; no carotid   bruit or JVD  Back:     Symmetric, no curvature, ROM normal, no CVA tenderness  Lungs:     Clear to auscultation bilaterally, respirations unlabored  Chest Wall:    No tenderness or deformity   Heart:    Regular rate and rhythm, S1 and S2 normal, no murmur, rub   or gallop  Breast Exam:    No tenderness, masses, or nipple abnormality  Abdomen:     Soft, non-tender, bowel sounds active all four quadrants,    no masses, no organomegaly  Genitalia:    Normal female without lesion, discharge or tenderness  Extremities:   Extremities normal, atraumatic, no cyanosis or edema  Pulses:   2+ and symmetric all extremities  Skin:   Skin color, texture, turgor normal, no rashes or lesions  Lymph nodes:   Cervical, supraclavicular,  and axillary nodes normal  Neurologic:   CNII-XII intact, normal strength, sensation and reflexes    throughout     Cervix: long, thick, closed and posterior.  FHR: 165 by doppler.  FH: 13 cm    Lab Review Urine pregnancy test Labs reviewed no Radiologic studies reviewed no  Assessment & Plan    Pregnancy at [redacted]w[redacted]d weeks    1. Encounter for supervision of normal pregnancy, antepartum, unspecified gravidity      - Obstetric Panel, Including HIV - Hemoglobinopathy evaluation - Cystic Fibrosis Mutation 97 - Culture, OB Urine - MaterniT21 PLUS Core+SCA - Hemoglobin A1c - Varicella zoster antibody, IgG  2. Unsure of LMP  (last menstrual period) as reason for ultrasound scan     - US OB Comp Less 14 Wks; Future - US OB Transvaginal; Future    Baby scripts optimized scheduled declined by patient Prenatal vitamins.  Counseling provided regarding continued use of seat belts, cessation of alcohol consumption, smoking or use of illicit drugs; infection precautions i.e., influenza/TDAP immunizations, toxoplasmosis,CMV, parvovirus, listeria and varicella; workplace safety, exercise during pregnancy; routine dental care, safe medications, sexual activity, hot tubs, saunas, pools, travel, caffeine use, fish and methlymercury, potential toxins, hair treatments, varicose veins Weight gain recommendations per IOM guidelines reviewed: underweight/BMI< 18.5--> gain 28 - 40 lbs; normal weight/BMI 18.5 - 24.9--> gain 25 - 35 lbs; overweight/BMI 25 - 29.9--> gain 15 - 25 lbs; obese/BMI >30->gain  11 - 20 lbs Problem list reviewed and updated. FIRST/CF mutation testing/NIPT/QUAD SCREEN/fragile X/Ashkenazi Jewish population testing/Spinal muscular atrophy discussed: ordered. Role of ultrasound in pregnancy discussed; fetal survey: requested. Amniocentesis discussed: not indicated.  No orders of the defined types were placed in this encounter.  Orders Placed This Encounter  Procedures  . Culture, OB Urine  . US OB Comp Less 14 Wks    Standing Status:   Future    Standing Expiration Date:   07/14/2018    Order Specific Question:   Reason for Exam (SYMPTOM  OR DIAGNOSIS REQUIRED)    Answer:   dating    Order Specific Question:   Preferred imaging location?    Answer:   [facility]  . US OB Transvaginal    Standing Status:   Future    Standing Expiration Date:   07/14/2018    Order Specific Question:   Reason for Exam (SYMPTOM  OR DIAGNOSIS REQUIRED)    Answer:   dating    Order Specific Question:   Preferred imaging location?    Answer:   [facility]  . Obstetric Panel, Including HIV  . Hemoglobinopathy  evaluation  . Cystic Fibrosis Mutation 97  . MaterniT21 PLUS Core+SCA    Order Specific Question:   Is the patient insulin dependent?    Answer:   No    Order Specific Question:   Please enter gestational age. This should be expressed as weeks AND days, i.e. 16w 6d. Enter weeks here. Enter days in next question.    Answer:   16    Order Specific Question:   Please enter gestational age. This should be expressed as weeks AND days, i.e. 16w 6d. Enter days here. Enter weeks in previous question.    Answer:   3    Order Specific Question:   How was gestational age calculated?    Answer:   LMP    Order Specific Question:   Please give the date of LMP OR Ultrasound OR Estimated date of delivery.    Answer:  11/30/2017    Order Specific Question:   Number of Fetuses (Type of Pregnancy):    Answer:   1    Order Specific Question:   Indications for performing the test? (please choose all that apply):    Answer:   Routine screening    Order Specific Question:   If this is a repeat specimen, please indicate the reason:    Answer:   Not indicated    Order Specific Question:   Please specify the patient's race: (C=White/Caucasion, B=Black, I=Native American, A=Asian, H=Hispanic, O=Other, U=Unknown)    Answer:   B    Order Specific Question:   Donor Egg - indicate if the egg was obtained from in vitro fertilization.    Answer:   N    Order Specific Question:   Age of Egg Donor.    Answer:   16    Order Specific Question:   Prior Down Syndrome/ONTD screening during current pregnancy.    Answer:   N    Order Specific Question:   Prior First Trimester Testing    Answer:   N    Order Specific Question:   Prior Second Trimester Testing    Answer:   N    Order Specific Question:   Family History of Neural Tube Defects    Answer:   N    Order Specific Question:   Prior Pregnancy with Down Syndrome    Answer:   N    Order Specific Question:   Please give the patient's weight (in pounds)    Answer:    152  . Hemoglobin A1c  . Varicella zoster antibody, IgG    Follow up in 4 weeks. 50% of 30 min visit spent on counseling and coordination of care.

## 2017-05-14 NOTE — Addendum Note (Signed)
Addended by: Dalphine Handing on: 05/14/2017 04:38 PM   Modules accepted: Orders

## 2017-05-15 LAB — CERVICOVAGINAL ANCILLARY ONLY
BACTERIAL VAGINITIS: NEGATIVE
CHLAMYDIA, DNA PROBE: NEGATIVE
Candida vaginitis: NEGATIVE
NEISSERIA GONORRHEA: NEGATIVE
Trichomonas: NEGATIVE

## 2017-05-16 ENCOUNTER — Other Ambulatory Visit: Payer: Self-pay | Admitting: Certified Nurse Midwife

## 2017-05-16 DIAGNOSIS — Z3480 Encounter for supervision of other normal pregnancy, unspecified trimester: Secondary | ICD-10-CM

## 2017-05-16 LAB — OBSTETRIC PANEL, INCLUDING HIV
Antibody Screen: NEGATIVE
BASOS ABS: 0 10*3/uL (ref 0.0–0.2)
Basos: 0 %
EOS (ABSOLUTE): 0.1 10*3/uL (ref 0.0–0.4)
Eos: 1 %
HEMATOCRIT: 37.5 % (ref 34.0–46.6)
HEMOGLOBIN: 12.8 g/dL (ref 11.1–15.9)
HIV SCREEN 4TH GENERATION: NONREACTIVE
Hepatitis B Surface Ag: NEGATIVE
IMMATURE GRANULOCYTES: 0 %
Immature Grans (Abs): 0 10*3/uL (ref 0.0–0.1)
LYMPHS ABS: 2.4 10*3/uL (ref 0.7–3.1)
Lymphs: 26 %
MCH: 30.9 pg (ref 26.6–33.0)
MCHC: 34.1 g/dL (ref 31.5–35.7)
MCV: 91 fL (ref 79–97)
MONOCYTES: 6 %
Monocytes Absolute: 0.5 10*3/uL (ref 0.1–0.9)
NEUTROS ABS: 6.4 10*3/uL (ref 1.4–7.0)
NEUTROS PCT: 67 %
PLATELETS: 288 10*3/uL (ref 150–379)
RBC: 4.14 x10E6/uL (ref 3.77–5.28)
RDW: 12.9 % (ref 12.3–15.4)
RPR: NONREACTIVE
Rh Factor: POSITIVE
Rubella Antibodies, IGG: 5.27 index (ref >0.99)
WBC: 9.4 10*3/uL (ref 3.4–10.8)

## 2017-05-16 LAB — HEMOGLOBINOPATHY EVALUATION
HEMOGLOBIN F QUANTITATION: 0 % (ref 0.0–2.0)
HGB A: 97.6 % (ref 96.4–98.8)
HGB C: 0 %
HGB S: 0 %
HGB VARIANT: 0 %
Hemoglobin A2 Quantitation: 2.4 % (ref 1.8–3.2)

## 2017-05-16 LAB — CYTOLOGY - PAP: DIAGNOSIS: NEGATIVE

## 2017-05-16 LAB — URINE CULTURE, OB REFLEX

## 2017-05-16 LAB — VARICELLA ZOSTER ANTIBODY, IGG: VARICELLA: 2954 {index} (ref >165)

## 2017-05-16 LAB — HEMOGLOBIN A1C
ESTIMATED AVERAGE GLUCOSE: 103 mg/dL
HEMOGLOBIN A1C: 5.2 % (ref 4.8–5.6)

## 2017-05-16 LAB — CULTURE, OB URINE

## 2017-05-20 ENCOUNTER — Other Ambulatory Visit: Payer: Self-pay | Admitting: Certified Nurse Midwife

## 2017-05-20 DIAGNOSIS — Z3480 Encounter for supervision of other normal pregnancy, unspecified trimester: Secondary | ICD-10-CM

## 2017-05-20 LAB — MATERNIT21 PLUS CORE+SCA
CHROMOSOME 18: NEGATIVE
CHROMOSOME 21: NEGATIVE
Chromosome 13: NEGATIVE
Y Chromosome: NOT DETECTED

## 2017-05-21 ENCOUNTER — Ambulatory Visit: Payer: Medicaid Other | Admitting: General Practice

## 2017-05-21 ENCOUNTER — Ambulatory Visit (HOSPITAL_COMMUNITY)
Admission: RE | Admit: 2017-05-21 | Discharge: 2017-05-21 | Disposition: A | Discharge status: Home / Self Care | Payer: Medicaid Other | Source: Ambulatory Visit | Attending: Certified Nurse Midwife | Admitting: Certified Nurse Midwife

## 2017-05-21 ENCOUNTER — Other Ambulatory Visit: Payer: Self-pay | Admitting: Certified Nurse Midwife

## 2017-05-21 DIAGNOSIS — Z3480 Encounter for supervision of other normal pregnancy, unspecified trimester: Secondary | ICD-10-CM

## 2017-05-21 DIAGNOSIS — Z3491 Encounter for supervision of normal pregnancy, unspecified, first trimester: Secondary | ICD-10-CM | POA: Insufficient documentation

## 2017-05-21 DIAGNOSIS — Z3687 Encounter for antenatal screening for uncertain dates: Secondary | ICD-10-CM | POA: Diagnosis not present

## 2017-05-21 DIAGNOSIS — Z3A12 12 weeks gestation of pregnancy: Secondary | ICD-10-CM | POA: Insufficient documentation

## 2017-05-21 DIAGNOSIS — Z712 Person consulting for explanation of examination or test findings: Secondary | ICD-10-CM

## 2017-05-21 LAB — CYSTIC FIBROSIS MUTATION 97: Interpretation: NOT DETECTED

## 2017-05-21 NOTE — Progress Notes (Signed)
Patient here for ultrasound results today. Reviewed ultrasound with Dr Rachelle Hora, who agrees with living IUP. Patient may resume care at [facility] office. Informed patient of results, dating & provided pictures. Patient verbalized understanding and had no questions

## 2017-05-21 NOTE — Progress Notes (Signed)
Agree with nursing staff's documentation of this patient's clinic encounter.  Somara Frymire, DO    

## 2017-06-11 ENCOUNTER — Ambulatory Visit (INDEPENDENT_AMBULATORY_CARE_PROVIDER_SITE_OTHER): Payer: Medicaid Other | Admitting: Certified Nurse Midwife

## 2017-06-11 ENCOUNTER — Other Ambulatory Visit: Payer: Self-pay | Admitting: Certified Nurse Midwife

## 2017-06-11 ENCOUNTER — Encounter: Payer: Self-pay | Admitting: Certified Nurse Midwife

## 2017-06-11 VITALS — BP 123/70 | HR 91 | Wt 156.0 lb

## 2017-06-11 DIAGNOSIS — Z3490 Encounter for supervision of normal pregnancy, unspecified, unspecified trimester: Secondary | ICD-10-CM

## 2017-06-11 MED ORDER — PRENATAL PLUS 27-1 MG PO TABS: 1.0000 | ORAL_TABLET | Freq: Every day | ORAL | 10 refills | Status: DC

## 2017-06-11 NOTE — Progress Notes (Signed)
PNV refilled

## 2017-06-11 NOTE — Progress Notes (Signed)
   PRENATAL VISIT NOTE  Subjective:  Diane [name] is a 26 y.o. G3P2002 at 817w3d being seen today for ongoing prenatal care.  She is currently monitored for the following issues for this low-risk pregnancy and has Dysmenorrhea; Dyspareunia in female; and Supervision of normal pregnancy, antepartum on her problem list.  Patient reports backache, no bleeding, no contractions, no cramping, no leaking and nausea has improved.  Contractions: Not present. Vag. Bleeding: None.   . Denies leaking of fluid.   The following portions of the patient's history were reviewed and updated as appropriate: allergies, current medications, past family history, past medical history, past social history, past surgical history and problem list. Problem list updated.  Objective:   Vitals:   06/11/17 1044  BP: 123/70  Pulse: 91  Weight: 156 lb (70.8 kg)    Fetal Status: Fetal Heart Rate (bpm): 146; doppler         General:  Alert, oriented and cooperative. Patient is in no acute distress.  Skin: Skin is warm and dry. No rash noted.   Cardiovascular: Normal heart rate noted  Respiratory: Normal respiratory effort, no problems with respiration noted  Abdomen: Soft, gravid, appropriate for gestational age.  Pain/Pressure: Absent     Pelvic: Cervical exam deferred        Extremities: Normal range of motion.  Edema: None  Mental Status:  Normal mood and affect. Normal behavior. Normal judgment and thought content.   Assessment and Plan:  Pregnancy: G3P2002 at 4217w3d  1. Encounter for supervision of normal pregnancy, antepartum, unspecified gravidity      Doing well - AFP, Serum, Open Spina Bifida - US MFM OB COMP + 14 WK; Future  Preterm labor symptoms and general obstetric precautions including but not limited to vaginal bleeding, contractions, leaking of fluid and fetal movement were reviewed in detail with the patient. Please refer to After Visit Summary for other counseling recommendations.    Return in about 4 weeks (around 07/09/2017) for ROB.   Roe Coombsachelle A Denney, CNM

## 2017-06-13 LAB — AFP, SERUM, OPEN SPINA BIFIDA
AFP MOM: 0.86
AFP VALUE AFPOSL: 27.5 ng/mL
GEST. AGE ON COLLECTION DATE: 15.4 wk
MATERNAL AGE AT EDD: 28 a
OSBR Risk 1 IN: 10000
Test Results:: NEGATIVE
WEIGHT: 156 [lb_av]

## 2017-06-14 ENCOUNTER — Other Ambulatory Visit: Payer: Self-pay | Admitting: Certified Nurse Midwife

## 2017-06-14 DIAGNOSIS — Z3480 Encounter for supervision of other normal pregnancy, unspecified trimester: Secondary | ICD-10-CM

## 2017-06-25 ENCOUNTER — Encounter (HOSPITAL_COMMUNITY): Payer: Self-pay | Admitting: Certified Nurse Midwife

## 2017-07-02 ENCOUNTER — Other Ambulatory Visit: Payer: Self-pay | Admitting: Certified Nurse Midwife

## 2017-07-02 ENCOUNTER — Ambulatory Visit (HOSPITAL_COMMUNITY)
Admission: RE | Admit: 2017-07-02 | Discharge: 2017-07-02 | Disposition: A | Discharge status: Home / Self Care | Payer: Medicaid Other | Source: Ambulatory Visit | Attending: Certified Nurse Midwife | Admitting: Certified Nurse Midwife

## 2017-07-02 DIAGNOSIS — O4402 Placenta previa specified as without hemorrhage, second trimester: Secondary | ICD-10-CM | POA: Insufficient documentation

## 2017-07-02 DIAGNOSIS — Z3689 Encounter for other specified antenatal screening: Secondary | ICD-10-CM | POA: Insufficient documentation

## 2017-07-02 DIAGNOSIS — Z3A18 18 weeks gestation of pregnancy: Secondary | ICD-10-CM | POA: Diagnosis not present

## 2017-07-02 DIAGNOSIS — Z3686 Encounter for antenatal screening for cervical length: Secondary | ICD-10-CM

## 2017-07-02 DIAGNOSIS — Z3490 Encounter for supervision of normal pregnancy, unspecified, unspecified trimester: Secondary | ICD-10-CM

## 2017-07-09 ENCOUNTER — Encounter: Payer: Self-pay | Admitting: Certified Nurse Midwife

## 2017-07-09 ENCOUNTER — Ambulatory Visit (INDEPENDENT_AMBULATORY_CARE_PROVIDER_SITE_OTHER): Payer: Medicaid Other | Admitting: Certified Nurse Midwife

## 2017-07-09 VITALS — BP 122/66 | HR 106 | Wt 159.0 lb

## 2017-07-09 DIAGNOSIS — O4440 Low lying placenta NOS or without hemorrhage, unspecified trimester: Secondary | ICD-10-CM | POA: Insufficient documentation

## 2017-07-09 DIAGNOSIS — O4402 Placenta previa specified as without hemorrhage, second trimester: Secondary | ICD-10-CM

## 2017-07-09 DIAGNOSIS — Z3480 Encounter for supervision of other normal pregnancy, unspecified trimester: Secondary | ICD-10-CM

## 2017-07-09 DIAGNOSIS — Z3482 Encounter for supervision of other normal pregnancy, second trimester: Secondary | ICD-10-CM

## 2017-07-09 NOTE — Progress Notes (Signed)
   PRENATAL VISIT NOTE  Subjective:  Diane CapersBrittney M [name] is a 26 y.o. G3P2002 at 3058w3d being seen today for ongoing prenatal care.  She is currently monitored for the following issues for this low-risk pregnancy and has Dysmenorrhea; Dyspareunia in female; Supervision of normal pregnancy, antepartum; and Placenta previa on their problem list.  Patient reports no complaints.  Contractions: Not present. Vag. Bleeding: None.  Movement: Present. Denies leaking of fluid.   The following portions of the patient's history were reviewed and updated as appropriate: allergies, current medications, past family history, past medical history, past social history, past surgical history and problem list. Problem list updated.  Objective:   Vitals:   07/09/17 1032  BP: 122/66  Pulse: (!) 106  Weight: 159 lb (72.1 kg)    Fetal Status:     Movement: Present     General:  Alert, oriented and cooperative. Patient is in no acute distress.  Skin: Skin is warm and dry. No rash noted.   Cardiovascular: Normal heart rate noted  Respiratory: Normal respiratory effort, no problems with respiration noted  Abdomen: Soft, gravid, appropriate for gestational age.  Pain/Pressure: Present     Pelvic: Cervical exam deferred        Extremities: Normal range of motion.  Edema: None  Mental Status:  Normal mood and affect. Normal behavior. Normal judgment and thought content.   Assessment and Plan:  Pregnancy: G3P2002 at 6558w3d  1. Supervision of other normal pregnancy, antepartum      Doing well.  Pelvic rest encouraged.  Denies any vaginal bleeding.   2. Placenta previa in second trimester     - US MFM OB FOLLOW UP; Future  Preterm labor symptoms and general obstetric precautions including but not limited to vaginal bleeding, contractions, leaking of fluid and fetal movement were reviewed in detail with the patient. Please refer to After Visit Summary for other counseling recommendations.  Return in about 4  weeks (around 08/06/2017) for ROB.   Roe Coombsachelle A Shade Kaley, CNM

## 2017-07-09 NOTE — Progress Notes (Signed)
Pt c/o low back and right side pain

## 2017-07-09 NOTE — Patient Instructions (Signed)
AREA PEDIATRIC/FAMILY PRACTICE PHYSICIANS  [facility] [address], [address] [place], [place]  [address] Phone - [phone]   Fax - [phone]  [facility] [address] [place], [place] [address] Phone - [phone]   Fax - [phone]  [facility] [address] [place], [place]  [address] Phone - [phone]   Fax - [phone]  [facility] [address] [place], [place]  [address] Phone - [phone]   Fax - [phone]  [facility] [address] [place], [place]  [address] Phone - [phone]   Fax - [phone]  [facility] [address], [address] [place], [place]  [address] Phone - [phone]   Fax - [phone]  [facility] [address] [place], [place]  [address] Phone - [phone]   Fax - [phone]  [facility] [address], [address] [place], [place]  [address] Phone - [phone]   Fax - [phone]  [facility] [address] [place], [place]  [address] Phone - [phone]   Fax - [phone] [facility] [address] [place], [place]  [address] Phone - [phone]   Fax - [phone]  [facility] [address] [place], [place]  [address] Phone - [phone]   Fax - [phone]  [facility] [address] [place], [place]  [address] Phone - [phone]   Fax - [phone]  [facility] [address] [place], [place]  [address] Phone - [phone]   Fax - [phone]  [facility] [address] [place], [place]  [address] Phone - [phone]  [facility] [address] [place], [place]  [address] Phone - [phone]   Fax - [phone]  [facility] [address] [place], [place]  [address] Phone - [phone]   Fax - [phone]  [facility] [address] [place], [place]  [address] Phone - [phone]   Fax - [phone]  [facility] [address] [place], [place]  [address] Phone - [phone]   Fax - [phone]   [facility] [address] [place], [place]  [address] Phone - [phone]   Fax - [phone]  [facility] [address] [place], [place]  [address] Phone - [phone]   Fax - [phone]  [facility] [address] [place], [place]  [address] Phone - [phone]   Fax - [phone]  [facility] [address] , [place]  [address] Phone - [phone]   Fax - [phone]  [facility] [address] , [place]  [address] Phone - [phone]   Fax - [phone] [facility] [address] [place], [place]  [address] Phone - [phone]   Fax - [phone]  [facility] [address] [place], [place]  [address] Phone - [phone]   Fax - [phone]  [facility] [address] , [place]  [address] Phone - [phone]   Fax - [phone]  [facility] Charlene Flemming MD [address] [place] [address] Phone [phone]  Fax [phone]   

## 2017-07-11 ENCOUNTER — Other Ambulatory Visit: Payer: Self-pay | Admitting: Certified Nurse Midwife

## 2017-07-11 ENCOUNTER — Encounter: Payer: Self-pay | Admitting: *Deleted

## 2017-07-11 DIAGNOSIS — Z3480 Encounter for supervision of other normal pregnancy, unspecified trimester: Secondary | ICD-10-CM

## 2017-08-07 ENCOUNTER — Ambulatory Visit (INDEPENDENT_AMBULATORY_CARE_PROVIDER_SITE_OTHER): Payer: Medicaid Other | Admitting: Obstetrics

## 2017-08-07 ENCOUNTER — Other Ambulatory Visit (HOSPITAL_COMMUNITY)
Admission: RE | Admit: 2017-08-07 | Discharge: 2017-08-07 | Disposition: A | Discharge status: Home / Self Care | Payer: Medicaid Other | Source: Ambulatory Visit | Attending: Obstetrics | Admitting: Obstetrics

## 2017-08-07 VITALS — BP 110/67 | HR 90 | Wt 163.0 lb

## 2017-08-07 DIAGNOSIS — M545 Low back pain, unspecified: Secondary | ICD-10-CM

## 2017-08-07 DIAGNOSIS — N898 Other specified noninflammatory disorders of vagina: Secondary | ICD-10-CM

## 2017-08-07 DIAGNOSIS — O26892 Other specified pregnancy related conditions, second trimester: Secondary | ICD-10-CM | POA: Diagnosis not present

## 2017-08-07 DIAGNOSIS — Z3A23 23 weeks gestation of pregnancy: Secondary | ICD-10-CM | POA: Insufficient documentation

## 2017-08-07 DIAGNOSIS — Z3482 Encounter for supervision of other normal pregnancy, second trimester: Secondary | ICD-10-CM

## 2017-08-07 MED ORDER — COMFORT FIT MATERNITY SUPP SM MISC: 0 refills | Status: DC

## 2017-08-07 NOTE — Progress Notes (Signed)
Pt states that she may have BV, would like check today. Pt would like to get pregnancy support belt.

## 2017-08-08 ENCOUNTER — Other Ambulatory Visit: Payer: Self-pay | Admitting: Certified Nurse Midwife

## 2017-08-08 ENCOUNTER — Other Ambulatory Visit (HOSPITAL_COMMUNITY): Payer: Self-pay | Admitting: *Deleted

## 2017-08-08 ENCOUNTER — Ambulatory Visit (HOSPITAL_COMMUNITY)
Admission: RE | Admit: 2017-08-08 | Discharge: 2017-08-08 | Disposition: A | Discharge status: Home / Self Care | Payer: Medicaid Other | Source: Ambulatory Visit | Attending: Certified Nurse Midwife | Admitting: Certified Nurse Midwife

## 2017-08-08 DIAGNOSIS — O4440 Low lying placenta NOS or without hemorrhage, unspecified trimester: Secondary | ICD-10-CM

## 2017-08-08 DIAGNOSIS — O4402 Placenta previa specified as without hemorrhage, second trimester: Secondary | ICD-10-CM

## 2017-08-08 DIAGNOSIS — O321XX0 Maternal care for breech presentation, not applicable or unspecified: Secondary | ICD-10-CM | POA: Diagnosis not present

## 2017-08-08 DIAGNOSIS — Z3A23 23 weeks gestation of pregnancy: Secondary | ICD-10-CM | POA: Insufficient documentation

## 2017-08-08 DIAGNOSIS — Z362 Encounter for other antenatal screening follow-up: Secondary | ICD-10-CM | POA: Diagnosis present

## 2017-08-08 DIAGNOSIS — O4442 Low lying placenta NOS or without hemorrhage, second trimester: Secondary | ICD-10-CM | POA: Diagnosis not present

## 2017-08-09 ENCOUNTER — Encounter: Payer: Self-pay | Admitting: Obstetrics

## 2017-08-09 LAB — CERVICOVAGINAL ANCILLARY ONLY
Bacterial vaginitis: NEGATIVE
CANDIDA VAGINITIS: NEGATIVE

## 2017-08-09 NOTE — Progress Notes (Signed)
Subjective:  Diane CapersBrittney M [name] is a 26 y.o. G3P2002 at 6150w6d being seen today for ongoing prenatal care.  She is currently monitored for the following issues for this low-risk pregnancy and has Dysmenorrhea; Dyspareunia in female; Supervision of normal pregnancy, antepartum; and Placenta previa on their problem list.  Patient reports backache.  Contractions: Not present. Vag. Bleeding: None.  Movement: Present. Denies leaking of fluid.   The following portions of the patient's history were reviewed and updated as appropriate: allergies, current medications, past family history, past medical history, past social history, past surgical history and problem list. Problem list updated.  Objective:   Vitals:   08/07/17 1045  BP: 110/67  Pulse: 90  Weight: 163 lb (73.9 kg)    Fetal Status: Fetal Heart Rate (bpm): 140   Movement: Present     General:  Alert, oriented and cooperative. Patient is in no acute distress.  Skin: Skin is warm and dry. No rash noted.   Cardiovascular: Normal heart rate noted  Respiratory: Normal respiratory effort, no problems with respiration noted  Abdomen: Soft, gravid, appropriate for gestational age. Pain/Pressure: Present     Pelvic:  Cervical exam deferred        Extremities: Normal range of motion.     Mental Status: Normal mood and affect. Normal behavior. Normal judgment and thought content.   Urinalysis:      Assessment and Plan:  Pregnancy: G3P2002 at 4450w6d  1. Encounter for supervision of other normal pregnancy in second trimester  - Cervicovaginal ancillary only  2. Acute midline low back pain without sciatica Rx: - Elastic Bandages & Supports (COMFORT FIT MATERNITY SUPP SM) MISC; Wear as directed.  Dispense: 1 each; Refill: 0  3. Vaginal irritation Rx: - Cervicovaginal ancillary only  Preterm labor symptoms and general obstetric precautions including but not limited to vaginal bleeding, contractions, leaking of fluid and fetal movement  were reviewed in detail with the patient. Please refer to After Visit Summary for other counseling recommendations.  No Follow-up on file.   Brock BadHarper, Charles A, MD

## 2017-08-13 NOTE — L&D Delivery Note (Signed)
VAGINAL DELIVERY NOTE  27 y.o. [id number] at 2847w1d delivered a viable female infant at 0451 in cephalic, OA position. There was a single nuchal cord reduced after delivery. Left anterior shoulder delivered with ease followed by 60 sec delayed cord clamping. Cord clamped x2 and cut. Placenta delivered spontaneously intact, with 3VC. Fundus firm on exam with massage and pitocin.  Mother: Anesthesia: epidural Laceration: none Suture repair: N/A EBL: 300 mL  Baby: Apgars: 9, 9 Weight: pending Cord pH: not sent  Good hemostasis noted.  Mom to postpartum.  Baby to couplet-care/skin-to-skin.  Durward Parcelavid McMullen, DO, PGY-2 11/24/2017, 5:10 AM

## 2017-08-17 ENCOUNTER — Inpatient Hospital Stay (HOSPITAL_COMMUNITY)
Admission: AD | Admit: 2017-08-17 | Discharge: 2017-08-17 | Disposition: A | Discharge status: Home / Self Care | Payer: Medicaid Other | Source: Ambulatory Visit | Attending: Obstetrics and Gynecology | Admitting: Obstetrics and Gynecology

## 2017-08-17 ENCOUNTER — Encounter (HOSPITAL_COMMUNITY): Payer: Self-pay

## 2017-08-17 DIAGNOSIS — N898 Other specified noninflammatory disorders of vagina: Secondary | ICD-10-CM | POA: Diagnosis not present

## 2017-08-17 DIAGNOSIS — B9689 Other specified bacterial agents as the cause of diseases classified elsewhere: Secondary | ICD-10-CM

## 2017-08-17 DIAGNOSIS — O26892 Other specified pregnancy related conditions, second trimester: Secondary | ICD-10-CM | POA: Diagnosis not present

## 2017-08-17 DIAGNOSIS — N760 Acute vaginitis: Secondary | ICD-10-CM

## 2017-08-17 DIAGNOSIS — Z3A25 25 weeks gestation of pregnancy: Secondary | ICD-10-CM | POA: Insufficient documentation

## 2017-08-17 LAB — WET PREP, GENITAL
SPERM: NONE SEEN
Trich, Wet Prep: NONE SEEN
Yeast Wet Prep HPF POC: NONE SEEN

## 2017-08-17 LAB — URINALYSIS, ROUTINE W REFLEX MICROSCOPIC
Bilirubin Urine: NEGATIVE
GLUCOSE, UA: NEGATIVE mg/dL
HGB URINE DIPSTICK: NEGATIVE
Ketones, ur: NEGATIVE mg/dL
Leukocytes, UA: NEGATIVE
NITRITE: NEGATIVE
Protein, ur: NEGATIVE mg/dL
Specific Gravity, Urine: 1.011 (ref 1.005–1.030)
pH: 7 (ref 5.0–8.0)

## 2017-08-17 MED ORDER — METRONIDAZOLE 500 MG PO TABS: 500.0000 mg | ORAL_TABLET | Freq: Two times a day (BID) | ORAL | 0 refills | Status: DC

## 2017-08-17 MED ORDER — METRONIDAZOLE 0.75 % VA GEL: 1.0000 | Freq: Two times a day (BID) | VAGINAL | 0 refills | Status: DC

## 2017-08-17 NOTE — Discharge Instructions (Signed)

## 2017-08-17 NOTE — MAU Note (Signed)
Pt states she has had really uncomfortable vaginal irritation and itching for 3 weeks.

## 2017-08-17 NOTE — MAU Provider Note (Signed)
History   G3P2002 @ 25 wks in with c/o vaginal discharge x 3 wks. Itches and burns. Denies ROM or vag bleeding.  CSN: [id number]  Arrival date & time 08/17/17  1549   None     Chief Complaint  Patient presents with  . Vaginal Itching    HPI  Past Medical History:  Diagnosis Date  . Anemia   . Headache    Migraines  . Medical history non-contributory     Past Surgical History:  Procedure Laterality Date  . LAPAROSCOPIC LYSIS OF ADHESIONS N/A 07/30/2016   Procedure: LAPAROSCOPIC LYSIS OF ADHESIONS;  Surgeon: [place] Bingharlie Pickens, MD;  Location: [facility];  Service: Gynecology;  Laterality: N/A;  . LAPAROSCOPY N/A 07/30/2016   Procedure: LAPAROSCOPY DIAGNOSTIC;  Surgeon: [place] Bingharlie Pickens, MD;  Location: [facility];  Service: Gynecology;  Laterality: N/A;  . WISDOM TOOTH EXTRACTION      History reviewed. No pertinent family history.  Social History   Tobacco Use  . Smoking status: Never Smoker  . Smokeless tobacco: Never Used  Substance Use Topics  . Alcohol use: No  . Drug use: No    OB History    Gravida Para Term Preterm AB Living   3 2 2     2    SAB TAB Ectopic Multiple Live Births           2      Obstetric Comments   SVD x 2      Review of Systems  Constitutional: Negative.   HENT: Negative.   Eyes: Negative.   Respiratory: Negative.   Cardiovascular: Negative.   Gastrointestinal: Negative.   Endocrine: Negative.   Genitourinary: Positive for vaginal discharge.  Musculoskeletal: Negative.   Skin: Negative.   Allergic/Immunologic: Negative.   Neurological: Negative.   Hematological: Negative.   Psychiatric/Behavioral: Negative.     Allergies  Patient has no known allergies.  Home Medications    BP (!) 108/58 (BP Location: Right Arm)   Pulse 89   Temp 98.5 F (36.9 C) (Oral)   Resp 16   Ht 5\' 5"  (1.651 m)   Wt 159 lb (72.1 kg)   LMP 02/23/2017   SpO2 98%   BMI 26.46 kg/m   Physical Exam  Constitutional: She is oriented to person, place, and  time. She appears well-developed and well-nourished.  HENT:  Head: Normocephalic.  Eyes: Pupils are equal, round, and reactive to light.  Neck: Normal range of motion.  Cardiovascular: Normal rate, regular rhythm, normal heart sounds and intact distal pulses.  Pulmonary/Chest: Effort normal and breath sounds normal.  Abdominal: Soft. Bowel sounds are normal.  Genitourinary: Vaginal discharge found.  Musculoskeletal: Normal range of motion.  Neurological: She is alert and oriented to person, place, and time. She has normal reflexes.  Skin: Skin is warm and dry.  Psychiatric: She has a normal mood and affect. Her behavior is normal. Judgment and thought content normal.    MAU Course  Procedures (including critical care time)  Labs Reviewed  WET PREP, GENITAL  URINALYSIS, ROUTINE W REFLEX MICROSCOPIC  GC/CHLAMYDIA PROBE AMP ([place]) NOT AT [facility]   No results found.   No diagnosis found.    MDM  VSS, FHR pattern reassuring. No contractions. Wet prep pos clue. Cultures obtained. Will treat for BV and d/c home

## 2017-08-18 LAB — OB RESULTS CONSOLE GC/CHLAMYDIA
Chlamydia: NEGATIVE
Gonorrhea: NEGATIVE

## 2017-08-19 ENCOUNTER — Other Ambulatory Visit: Payer: Self-pay | Admitting: Certified Nurse Midwife

## 2017-08-19 LAB — GC/CHLAMYDIA PROBE AMP (~~LOC~~) NOT AT ARMC
CHLAMYDIA, DNA PROBE: NEGATIVE
NEISSERIA GONORRHEA: NEGATIVE

## 2017-08-21 ENCOUNTER — Encounter: Payer: Self-pay | Admitting: *Deleted

## 2017-09-04 ENCOUNTER — Encounter: Payer: Self-pay | Admitting: Obstetrics

## 2017-09-04 ENCOUNTER — Other Ambulatory Visit: Payer: Medicaid Other

## 2017-09-04 ENCOUNTER — Ambulatory Visit (INDEPENDENT_AMBULATORY_CARE_PROVIDER_SITE_OTHER): Payer: Medicaid Other | Admitting: Obstetrics

## 2017-09-04 VITALS — Wt 165.2 lb

## 2017-09-04 DIAGNOSIS — Z3490 Encounter for supervision of normal pregnancy, unspecified, unspecified trimester: Secondary | ICD-10-CM

## 2017-09-04 DIAGNOSIS — Z3482 Encounter for supervision of other normal pregnancy, second trimester: Secondary | ICD-10-CM

## 2017-09-04 NOTE — Progress Notes (Signed)
Subjective:  Diane [name] is a 27 y.o. G3P2002 at 3590w4d being seen today for ongoing prenatal care.  She is currently monitored for the following issues for this low-risk pregnancy and has Dysmenorrhea; Dyspareunia in female; Supervision of normal pregnancy, antepartum; and Placenta previa on their problem list.  Patient reports no complaints.  Contractions: Irregular. Vag. Bleeding: None.  Movement: Present. Denies leaking of fluid.   The following portions of the patient's history were reviewed and updated as appropriate: allergies, current medications, past family history, past medical history, past social history, past surgical history and problem list. Problem list updated.  Objective:   Vitals:   09/04/17 0955  Weight: 165 lb 3.2 oz (74.9 kg)    Fetal Status: Fetal Heart Rate (bpm): 140   Movement: Present     General:  Alert, oriented and cooperative. Patient is in no acute distress.  Skin: Skin is warm and dry. No rash noted.   Cardiovascular: Normal heart rate noted  Respiratory: Normal respiratory effort, no problems with respiration noted  Abdomen: Soft, gravid, appropriate for gestational age. Pain/Pressure: Present     Pelvic:  Cervical exam deferred        Extremities: Normal range of motion.  Edema: None  Mental Status: Normal mood and affect. Normal behavior. Normal judgment and thought content.   Urinalysis:      Assessment and Plan:  Pregnancy: G3P2002 at 5390w4d  1. Encounter for supervision of normal pregnancy, antepartum, unspecified gravidity Rx: - Glucose Tolerance, 2 Hours w/1 Hour - CBC - HIV antibody (with reflex) - RPR  Preterm labor symptoms and general obstetric precautions including but not limited to vaginal bleeding, contractions, leaking of fluid and fetal movement were reviewed in detail with the patient. Please refer to After Visit Summary for other counseling recommendations.  Return in about 2 weeks (around 09/18/2017) for  ROB.   Brock BadHarper, Charles A, MD

## 2017-09-05 LAB — CBC
Hematocrit: 33.8 % — ABNORMAL LOW (ref 34.0–46.6)
Hemoglobin: 11.4 g/dL (ref 11.1–15.9)
MCH: 30.7 pg (ref 26.6–33.0)
MCHC: 33.7 g/dL (ref 31.5–35.7)
MCV: 91 fL (ref 79–97)
PLATELETS: 238 10*3/uL (ref 150–379)
RBC: 3.71 x10E6/uL — AB (ref 3.77–5.28)
RDW: 13.9 % (ref 12.3–15.4)
WBC: 11.7 10*3/uL — ABNORMAL HIGH (ref 3.4–10.8)

## 2017-09-05 LAB — GLUCOSE TOLERANCE, 2 HOURS W/ 1HR
GLUCOSE, 2 HOUR: 129 mg/dL (ref 65–152)
GLUCOSE, FASTING: 82 mg/dL (ref 65–91)
Glucose, 1 hour: 141 mg/dL (ref 65–179)

## 2017-09-05 LAB — RPR: RPR: NONREACTIVE

## 2017-09-05 LAB — HIV ANTIBODY (ROUTINE TESTING W REFLEX): HIV SCREEN 4TH GENERATION: NONREACTIVE

## 2017-09-18 ENCOUNTER — Encounter: Payer: Self-pay | Admitting: Obstetrics

## 2017-09-18 ENCOUNTER — Ambulatory Visit (INDEPENDENT_AMBULATORY_CARE_PROVIDER_SITE_OTHER): Payer: Medicaid Other | Admitting: Obstetrics

## 2017-09-18 VITALS — BP 116/75 | HR 101 | Wt 168.7 lb

## 2017-09-18 DIAGNOSIS — O99713 Diseases of the skin and subcutaneous tissue complicating pregnancy, third trimester: Secondary | ICD-10-CM

## 2017-09-18 DIAGNOSIS — O0990 Supervision of high risk pregnancy, unspecified, unspecified trimester: Secondary | ICD-10-CM

## 2017-09-18 DIAGNOSIS — L299 Pruritus, unspecified: Secondary | ICD-10-CM

## 2017-09-18 DIAGNOSIS — O4443 Low lying placenta NOS or without hemorrhage, third trimester: Secondary | ICD-10-CM

## 2017-09-18 DIAGNOSIS — O0993 Supervision of high risk pregnancy, unspecified, third trimester: Secondary | ICD-10-CM

## 2017-09-18 DIAGNOSIS — O4440 Low lying placenta NOS or without hemorrhage, unspecified trimester: Secondary | ICD-10-CM

## 2017-09-18 NOTE — Progress Notes (Signed)
Subjective:  Diane CapersBrittney M [name] is a 27 y.o. G3P2002 at 7814w4d being seen today for ongoing prenatal care.  She is currently monitored for the following issues for this high-risk pregnancy and has Dysmenorrhea; Dyspareunia in female; Supervision of normal pregnancy, antepartum; and Placenta previa on their problem list.  Patient reports itching all over.  Contractions: Not present. Vag. Bleeding: None.  Movement: Present. Denies leaking of fluid.   The following portions of the patient's history were reviewed and updated as appropriate: allergies, current medications, past family history, past medical history, past social history, past surgical history and problem list. Problem list updated.  Objective:   Vitals:   09/18/17 1122  BP: 116/75  Pulse: (!) 101  Weight: 168 lb 11.2 oz (76.5 kg)    Fetal Status: Fetal Heart Rate (bpm): 140   Movement: Present     General:  Alert, oriented and cooperative. Patient is in no acute distress.  Skin: Skin is warm and dry. No rash noted.   Cardiovascular: Normal heart rate noted  Respiratory: Normal respiratory effort, no problems with respiration noted  Abdomen: Soft, gravid, appropriate for gestational age. Pain/Pressure: Present     Pelvic:  Cervical exam deferred        Extremities: Normal range of motion.  Edema: Trace  Mental Status: Normal mood and affect. Normal behavior. Normal judgment and thought content.   Urinalysis:      Assessment and Plan:  Pregnancy: G3P2002 at 5214w4d  1. Supervision of high risk pregnancy, antepartum  2. Low lying placenta, antepartum - follow up ultrasound scheduled for placenta location - previa precautions givev  3. Pruritus of pregnancy in third trimester Rx: - Comprehensive metabolic panel  Preterm labor symptoms and general obstetric precautions including but not limited to vaginal bleeding, contractions, leaking of fluid and fetal movement were reviewed in detail with the patient. Please refer to  After Visit Summary for other counseling recommendations.  Return in about 2 weeks (around 10/02/2017) for ROB.   Brock BadHarper, Charles A, MD

## 2017-09-18 NOTE — Progress Notes (Signed)
Patient reports good fetal movement, denies pain and reports a lot of itching recently.

## 2017-09-19 LAB — COMPREHENSIVE METABOLIC PANEL
ALBUMIN: 3.5 g/dL (ref 3.5–5.5)
ALK PHOS: 72 IU/L (ref 39–117)
ALT: 17 IU/L (ref 0–32)
AST: 16 IU/L (ref 0–40)
Albumin/Globulin Ratio: 1.2 (ref 1.2–2.2)
BUN/Creatinine Ratio: 7 — ABNORMAL LOW (ref 9–23)
BUN: 4 mg/dL — AB (ref 6–20)
Bilirubin Total: 0.2 mg/dL (ref 0.0–1.2)
CHLORIDE: 101 mmol/L (ref 96–106)
CO2: 20 mmol/L (ref 20–29)
Calcium: 9.1 mg/dL (ref 8.7–10.2)
Creatinine, Ser: 0.54 mg/dL — ABNORMAL LOW (ref 0.57–1.00)
GFR calc Af Amer: 150 mL/min/{1.73_m2} (ref >59)
GFR calc non Af Amer: 130 mL/min/{1.73_m2} (ref >59)
GLUCOSE: 129 mg/dL — AB (ref 65–99)
Globulin, Total: 3 g/dL (ref 1.5–4.5)
Potassium: 3.5 mmol/L (ref 3.5–5.2)
Sodium: 138 mmol/L (ref 134–144)
Total Protein: 6.5 g/dL (ref 6.0–8.5)

## 2017-10-02 ENCOUNTER — Ambulatory Visit (INDEPENDENT_AMBULATORY_CARE_PROVIDER_SITE_OTHER): Payer: Medicaid Other | Admitting: Obstetrics

## 2017-10-02 ENCOUNTER — Encounter: Payer: Self-pay | Admitting: Obstetrics

## 2017-10-02 ENCOUNTER — Other Ambulatory Visit: Payer: Self-pay

## 2017-10-02 VITALS — BP 117/66 | HR 97 | Wt 168.9 lb

## 2017-10-02 DIAGNOSIS — O0990 Supervision of high risk pregnancy, unspecified, unspecified trimester: Secondary | ICD-10-CM

## 2017-10-02 DIAGNOSIS — O4443 Low lying placenta NOS or without hemorrhage, third trimester: Secondary | ICD-10-CM

## 2017-10-02 DIAGNOSIS — O4440 Low lying placenta NOS or without hemorrhage, unspecified trimester: Secondary | ICD-10-CM

## 2017-10-02 DIAGNOSIS — O0993 Supervision of high risk pregnancy, unspecified, third trimester: Secondary | ICD-10-CM

## 2017-10-02 NOTE — Progress Notes (Signed)
Subjective:  [name] is a 27 y.o. G3P2002 at 311w4d being seen today for ongoing prenatal care.  She is currently monitored for the following issues for this high-risk pregnancy and has Dysmenorrhea; Dyspareunia in female; Supervision of normal pregnancy, antepartum; and Placenta previa on their problem list.  Patient reports no complaints.  Contractions: Irritability. Vag. Bleeding: None.  Movement: Present. Denies leaking of fluid.   The following portions of the patient's history were reviewed and updated as appropriate: allergies, current medications, past family history, past medical history, past social history, past surgical history and problem list. Problem list updated.  Objective:   Vitals:   10/02/17 0956  BP: 117/66  Pulse: 97  Weight: 168 lb 14.4 oz (76.6 kg)    Fetal Status: Fetal Heart Rate (bpm): 140   Movement: Present     General:  Alert, oriented and cooperative. Patient is in no acute distress.  Skin: Skin is warm and dry. No rash noted.   Cardiovascular: Normal heart rate noted  Respiratory: Normal respiratory effort, no problems with respiration noted  Abdomen: Soft, gravid, appropriate for gestational age. Pain/Pressure: Present     Pelvic:  Cervical exam deferred        Extremities: Normal range of motion.  Edema: Trace  Mental Status: Normal mood and affect. Normal behavior. Normal judgment and thought content.   Urinalysis:      Assessment and Plan:  Pregnancy: G3P2002 at 751w4d  1. Supervision of high risk pregnancy, antepartum  2. Low lying placenta, antepartum - FOLLOW UP ULTRASOUND SCHEDULED - PREVIA PRECAUTIONS  Preterm labor symptoms and general obstetric precautions including but not limited to vaginal bleeding, contractions, leaking of fluid and fetal movement were reviewed in detail with the patient. Please refer to After Visit Summary for other counseling recommendations.  Return in about 2 weeks (around 10/16/2017) for  ROB.   Brock BadHarper, Annia Gomm A, MD

## 2017-10-03 ENCOUNTER — Other Ambulatory Visit (HOSPITAL_COMMUNITY): Payer: Self-pay | Admitting: *Deleted

## 2017-10-03 ENCOUNTER — Encounter (HOSPITAL_COMMUNITY): Payer: Self-pay

## 2017-10-03 ENCOUNTER — Other Ambulatory Visit (HOSPITAL_COMMUNITY): Payer: Self-pay | Admitting: Maternal and Fetal Medicine

## 2017-10-03 ENCOUNTER — Ambulatory Visit (HOSPITAL_COMMUNITY)
Admission: RE | Admit: 2017-10-03 | Discharge: 2017-10-03 | Disposition: A | Discharge status: Home / Self Care | Payer: Medicaid Other | Source: Ambulatory Visit | Attending: Obstetrics | Admitting: Obstetrics

## 2017-10-03 DIAGNOSIS — O4440 Low lying placenta NOS or without hemorrhage, unspecified trimester: Secondary | ICD-10-CM

## 2017-10-03 DIAGNOSIS — O4403 Placenta previa specified as without hemorrhage, third trimester: Secondary | ICD-10-CM | POA: Diagnosis not present

## 2017-10-03 DIAGNOSIS — Z3A31 31 weeks gestation of pregnancy: Secondary | ICD-10-CM

## 2017-10-03 DIAGNOSIS — O4443 Low lying placenta NOS or without hemorrhage, third trimester: Secondary | ICD-10-CM | POA: Diagnosis present

## 2017-10-03 NOTE — Addendum Note (Signed)
Encounter addended by: Genevie CheshireWaken, Darrel Baroni M, RT on: 10/03/2017 12:14 PM  Actions taken: Imaging Exam ended

## 2017-10-17 ENCOUNTER — Encounter: Payer: Self-pay | Admitting: Obstetrics

## 2017-10-17 ENCOUNTER — Other Ambulatory Visit: Payer: Self-pay

## 2017-10-17 ENCOUNTER — Ambulatory Visit (INDEPENDENT_AMBULATORY_CARE_PROVIDER_SITE_OTHER): Payer: Medicaid Other | Admitting: Obstetrics

## 2017-10-17 VITALS — BP 111/65 | HR 102 | Wt 173.8 lb

## 2017-10-17 DIAGNOSIS — O0990 Supervision of high risk pregnancy, unspecified, unspecified trimester: Secondary | ICD-10-CM

## 2017-10-17 DIAGNOSIS — O4400 Placenta previa specified as without hemorrhage, unspecified trimester: Secondary | ICD-10-CM

## 2017-10-17 DIAGNOSIS — O4403 Placenta previa specified as without hemorrhage, third trimester: Secondary | ICD-10-CM

## 2017-10-17 DIAGNOSIS — O0993 Supervision of high risk pregnancy, unspecified, third trimester: Secondary | ICD-10-CM

## 2017-10-17 NOTE — Progress Notes (Signed)
Subjective:  Diane CapersBrittney M [name] is a 27 y.o. G3P2002 at 7234w5d being seen today for ongoing prenatal care.  She is currently monitored for the following issues for this high-risk pregnancy and has Dysmenorrhea; Dyspareunia in female; Supervision of normal pregnancy, antepartum; and Placenta previa on their problem list.  Patient reports placenta previa.  Contractions: Irregular. Vag. Bleeding: None.  Movement: Present. Denies leaking of fluid.   The following portions of the patient's history were reviewed and updated as appropriate: allergies, current medications, past family history, past medical history, past social history, past surgical history and problem list. Problem list updated.  Objective:   Vitals:   10/17/17 1025  BP: 111/65  Pulse: (!) 102  Weight: 173 lb 12.8 oz (78.8 kg)    Fetal Status: Fetal Heart Rate (bpm): 140   Movement: Present     General:  Alert, oriented and cooperative. Patient is in no acute distress.  Skin: Skin is warm and dry. No rash noted.   Cardiovascular: Normal heart rate noted  Respiratory: Normal respiratory effort, no problems with respiration noted  Abdomen: Soft, gravid, appropriate for gestational age. Pain/Pressure: Present     Pelvic:  Cervical exam deferred        Extremities: Normal range of motion.  Edema: Trace  Mental Status: Normal mood and affect. Normal behavior. Normal judgment and thought content.   Urinalysis:      Assessment and Plan:  Pregnancy: G3P2002 at 5334w5d  1. Supervision of high risk pregnancy, antepartum  2. Placenta previa antepartum - follow up ultrasound scheduled - pelvic rest  Preterm labor symptoms and general obstetric precautions including but not limited to vaginal bleeding, contractions, leaking of fluid and fetal movement were reviewed in detail with the patient. Please refer to After Visit Summary for other counseling recommendations.  Return in about 2 weeks (around 10/31/2017) for ROB.   Brock BadHarper,  Charles A, MD

## 2017-10-24 ENCOUNTER — Other Ambulatory Visit (HOSPITAL_COMMUNITY): Payer: Self-pay | Admitting: Obstetrics and Gynecology

## 2017-10-24 ENCOUNTER — Other Ambulatory Visit (HOSPITAL_COMMUNITY): Payer: Self-pay | Admitting: *Deleted

## 2017-10-24 ENCOUNTER — Ambulatory Visit (HOSPITAL_COMMUNITY)
Admission: RE | Admit: 2017-10-24 | Discharge: 2017-10-24 | Disposition: A | Discharge status: Home / Self Care | Payer: Medicaid Other | Source: Ambulatory Visit | Attending: Obstetrics | Admitting: Obstetrics

## 2017-10-24 ENCOUNTER — Encounter (HOSPITAL_COMMUNITY): Payer: Self-pay

## 2017-10-24 DIAGNOSIS — O4403 Placenta previa specified as without hemorrhage, third trimester: Secondary | ICD-10-CM

## 2017-10-24 DIAGNOSIS — O4440 Low lying placenta NOS or without hemorrhage, unspecified trimester: Secondary | ICD-10-CM

## 2017-10-24 DIAGNOSIS — Z3A34 34 weeks gestation of pregnancy: Secondary | ICD-10-CM | POA: Insufficient documentation

## 2017-10-24 DIAGNOSIS — Z362 Encounter for other antenatal screening follow-up: Secondary | ICD-10-CM | POA: Diagnosis present

## 2017-10-24 DIAGNOSIS — Z09 Encounter for follow-up examination after completed treatment for conditions other than malignant neoplasm: Secondary | ICD-10-CM

## 2017-10-24 DIAGNOSIS — O09893 Supervision of other high risk pregnancies, third trimester: Secondary | ICD-10-CM | POA: Insufficient documentation

## 2017-10-31 ENCOUNTER — Encounter: Payer: Self-pay | Admitting: Certified Nurse Midwife

## 2017-10-31 ENCOUNTER — Encounter: Payer: Medicaid Other | Admitting: Obstetrics

## 2017-10-31 ENCOUNTER — Ambulatory Visit (INDEPENDENT_AMBULATORY_CARE_PROVIDER_SITE_OTHER): Payer: Medicaid Other | Admitting: Certified Nurse Midwife

## 2017-10-31 ENCOUNTER — Other Ambulatory Visit (HOSPITAL_COMMUNITY)
Admission: RE | Admit: 2017-10-31 | Discharge: 2017-10-31 | Disposition: A | Discharge status: Home / Self Care | Payer: Medicaid Other | Source: Ambulatory Visit | Attending: Obstetrics | Admitting: Obstetrics

## 2017-10-31 VITALS — BP 110/71 | HR 99 | Wt 174.2 lb

## 2017-10-31 DIAGNOSIS — B373 Candidiasis of vulva and vagina: Secondary | ICD-10-CM | POA: Insufficient documentation

## 2017-10-31 DIAGNOSIS — Z3490 Encounter for supervision of normal pregnancy, unspecified, unspecified trimester: Secondary | ICD-10-CM

## 2017-10-31 NOTE — Progress Notes (Signed)
   PRENATAL VISIT NOTE  Subjective:  Diane CapersBrittney M [name] is a 27 y.o. G3P2002 at 434w5d being seen today for ongoing prenatal care.  She is currently monitored for the following issues for this low-risk pregnancy and has Dysmenorrhea; Dyspareunia in female; Supervision of normal pregnancy, antepartum; and Low-lying placenta on their problem list.  Patient reports no complaints.  Contractions: Irregular. Vag. Bleeding: None.  Movement: Present. Denies leaking of fluid.   The following portions of the patient's history were reviewed and updated as appropriate: allergies, current medications, past family history, past medical history, past social history, past surgical history and problem list. Problem list updated.  Objective:   Vitals:   10/31/17 1042  BP: 110/71  Pulse: 99  Weight: 174 lb 3.2 oz (79 kg)    Fetal Status: Fetal Heart Rate (bpm): 136; doppler Fundal Height: 35 cm Movement: Present     General:  Alert, oriented and cooperative. Patient is in no acute distress.  Skin: Skin is warm and dry. No rash noted.   Cardiovascular: Normal heart rate noted  Respiratory: Normal respiratory effort, no problems with respiration noted  Abdomen: Soft, gravid, appropriate for gestational age.  Pain/Pressure: Present     Pelvic: Cervical exam deferred        Extremities: Normal range of motion.  Edema: Trace  Mental Status:  Normal mood and affect. Normal behavior. Normal judgment and thought content.   Assessment and Plan:  Pregnancy: G3P2002 at 4934w5d  1. Encounter for supervision of normal pregnancy, antepartum, unspecified gravidity     Doing well.   Has f/u US scheduled next week for low lying placenta.   - Strep Gp B NAA - Cervicovaginal ancillary only  Preterm labor symptoms and general obstetric precautions including but not limited to vaginal bleeding, contractions, leaking of fluid and fetal movement were reviewed in detail with the patient. Please refer to After Visit  Summary for other counseling recommendations.  Return in about 1 week (around 11/07/2017) for ROB.   Roe Coombsachelle A Kim Oki, CNM

## 2017-11-01 LAB — CERVICOVAGINAL ANCILLARY ONLY
Bacterial vaginitis: NEGATIVE
Candida vaginitis: POSITIVE — AB
Chlamydia: NEGATIVE
NEISSERIA GONORRHEA: NEGATIVE
Trichomonas: NEGATIVE

## 2017-11-01 LAB — OB RESULTS CONSOLE GBS: STREP GROUP B AG: NEGATIVE

## 2017-11-02 LAB — STREP GP B NAA: Strep Gp B NAA: NEGATIVE

## 2017-11-07 ENCOUNTER — Encounter: Payer: Self-pay | Admitting: Certified Nurse Midwife

## 2017-11-07 ENCOUNTER — Ambulatory Visit (HOSPITAL_COMMUNITY)
Admission: RE | Admit: 2017-11-07 | Discharge: 2017-11-07 | Disposition: A | Discharge status: Home / Self Care | Payer: Medicaid Other | Source: Ambulatory Visit | Attending: Certified Nurse Midwife | Admitting: Certified Nurse Midwife

## 2017-11-07 ENCOUNTER — Other Ambulatory Visit: Payer: Self-pay

## 2017-11-07 ENCOUNTER — Encounter (HOSPITAL_COMMUNITY): Payer: Self-pay

## 2017-11-07 ENCOUNTER — Ambulatory Visit (INDEPENDENT_AMBULATORY_CARE_PROVIDER_SITE_OTHER): Payer: Medicaid Other | Admitting: Certified Nurse Midwife

## 2017-11-07 VITALS — BP 110/72 | HR 112 | Wt 175.5 lb

## 2017-11-07 DIAGNOSIS — B373 Candidiasis of vulva and vagina: Secondary | ICD-10-CM

## 2017-11-07 DIAGNOSIS — O4443 Low lying placenta NOS or without hemorrhage, third trimester: Secondary | ICD-10-CM | POA: Insufficient documentation

## 2017-11-07 DIAGNOSIS — Z3A36 36 weeks gestation of pregnancy: Secondary | ICD-10-CM | POA: Insufficient documentation

## 2017-11-07 DIAGNOSIS — Z3480 Encounter for supervision of other normal pregnancy, unspecified trimester: Secondary | ICD-10-CM

## 2017-11-07 DIAGNOSIS — B3731 Acute candidiasis of vulva and vagina: Secondary | ICD-10-CM

## 2017-11-07 DIAGNOSIS — O4440 Low lying placenta NOS or without hemorrhage, unspecified trimester: Secondary | ICD-10-CM

## 2017-11-07 MED ORDER — TERCONAZOLE 0.8 % VA CREA: 1.0000 | TOPICAL_CREAM | Freq: Every day | VAGINAL | 0 refills | Status: DC

## 2017-11-07 MED ORDER — FLUCONAZOLE 150 MG PO TABS: 150.0000 mg | ORAL_TABLET | Freq: Once | ORAL | 0 refills | Status: AC

## 2017-11-07 NOTE — Addendum Note (Signed)
Encounter addended by: Lenise ArenaBazemore, Ossiel Marchio, RDMS on: 11/07/2017 12:44 PM  Actions taken: Imaging Exam ended

## 2017-11-07 NOTE — Patient Instructions (Signed)
Before [facility] Before your baby arrives it is important to:  Have all of the supplies that you will need to care for your baby.  Know where to go if there is an emergency.  Discuss the baby's arrival with other family members.  What supplies will I need?  It is recommended that you have the following supplies: Large Items  Crib.  Crib mattress.  Rear-facing infant car seat. If possible, have a trained professional check to make sure that it is installed correctly.  Feeding  6-8 bottles that are 4-5 oz in size.  6-8 nipples.  Bottle brush.  Sterilizer, or a large pan or kettle with a lid.  A way to boil and cool water.  If you will be breastfeeding: ? Breast pump. ? Nipple cream. ? Nursing bra. ? Breast pads. ? Breast shields.  If you will be formula feeding: ? Formula. ? Measuring cups. ? Measuring spoons.  Bathing  Mild baby soap and baby shampoo.  Petroleum jelly.  Soft cloth towel and washcloth.  Hooded towel.  Cotton balls.  Bath basin.  Other Supplies  Rectal thermometer.  Bulb syringe.  Baby wipes or washcloths for diaper changes.  Diaper bag.  Changing pad.  Clothing, including one-piece outfits and pajamas.  Baby nail clippers.  Receiving blankets.  Mattress pad and sheets for the crib.  Night-light for the baby's room.  Baby monitor.  2 or 3 pacifiers.  Either 24-36 cloth diapers and waterproof diaper covers or a box of disposable diapers. You may need to use as many as 10-12 diapers per day.  How do I prepare for an emergency? Prepare for an emergency by:  Knowing how to get to the nearest hospital.  Listing the phone numbers of your baby's health care providers near your home phone and in your cell phone.  How do I prepare my family?  Decide how to handle visitors.  If you have other children: ? Talk with them about the baby coming home. Ask them how they feel about it. ? Read a book together about  being a new big brother or sister. ? Find ways to let them help you prepare for the new baby. ? Have someone ready to care for them while you are in the hospital. This information is not intended to replace advice given to you by your health care provider. Make sure you discuss any questions you have with your health care provider. Document Released: 07/12/2008 Document Revised: 01/05/2016 Document Reviewed: 07/07/2014 Elsevier Interactive Patient Education  2018 [employer].  [employer] of the uterus can occur throughout pregnancy, but they are not always a sign that you are in labor. You may have practice contractions called Braxton Hicks contractions. These false labor contractions are sometimes confused with true labor. What are [place] contractions? Braxton Hicks contractions are tightening movements that occur in the muscles of the uterus before labor. Unlike true labor contractions, these contractions do not result in opening (dilation) and thinning of the cervix. Toward the end of pregnancy (32-34 weeks), Braxton Hicks contractions can happen more often and may become stronger. These contractions are sometimes difficult to tell apart from true labor because they can be very uncomfortable. You should not feel embarrassed if you go to the hospital with false labor. Sometimes, the only way to tell if you are in true labor is for your health care provider to look for changes in the cervix. The health care provider will do a  physical exam and may monitor your contractions. If you are not in true labor, the exam should show that your cervix is not dilating and your water has not broken. If there are other health problems associated with your pregnancy, it is completely safe for you to be sent home with false labor. You may continue to have Braxton Hicks contractions until you go into true labor. How to tell the difference between true labor and false labor True  labor  Contractions last 30-70 seconds.  Contractions become very regular.  Discomfort is usually felt in the top of the uterus, and it spreads to the lower abdomen and low back.  Contractions do not go away with walking.  Contractions usually become more intense and increase in frequency.  The cervix dilates and gets thinner. False labor  Contractions are usually shorter and not as strong as true labor contractions.  Contractions are usually irregular.  Contractions are often felt in the front of the lower abdomen and in the groin.  Contractions may go away when you walk around or change positions while lying down.  Contractions get weaker and are shorter-lasting as time goes on.  The cervix usually does not dilate or become thin. Follow these instructions at home:  Take over-the-counter and prescription medicines only as told by your health care provider.  Keep up with your usual exercises and follow other instructions from your health care provider.  Eat and drink lightly if you think you are going into labor.  If Braxton Hicks contractions are making you uncomfortable: ? Change your position from lying down or resting to walking, or change from walking to resting. ? Sit and rest in a tub of warm water. ? Drink enough fluid to keep your urine pale yellow. Dehydration may cause these contractions. ? Do slow and deep breathing several times an hour.  Keep all follow-up prenatal visits as told by your health care provider. This is important. Contact a health care provider if:  You have a fever.  You have continuous pain in your abdomen. Get help right away if:  Your contractions become stronger, more regular, and closer together.  You have fluid leaking or gushing from your vagina.  You pass blood-tinged mucus (bloody show).  You have bleeding from your vagina.  You have low back pain that you never had before.  You feel your baby's head pushing down and  causing pelvic pressure.  Your baby is not moving inside you as much as it used to. Summary  Contractions that occur before labor are called Braxton Hicks contractions, false labor, or practice contractions.  Braxton Hicks contractions are usually shorter, weaker, farther apart, and less regular than true labor contractions. True labor contractions usually become progressively stronger and regular and they become more frequent.  [facility] by changing position, resting in a warm bath, drinking plenty of water, or practicing deep breathing. This information is not intended to replace advice given to you by your health care provider. Make sure you discuss any questions you have with your health care provider. Document Released: 12/13/2016 Document Revised: 12/13/2016 Document Reviewed: 12/13/2016 Elsevier Interactive Patient Education  2018 [employer]  Placenta Previa Placenta previa is a condition in which the placenta implants in the lower part of the uterus in pregnant women. The placenta either partially or completely covers the opening to the cervix. This is a problem because the baby must pass through the cervix during delivery. There are three types  of placenta previa:  Marginal placenta previa. The placenta reaches within an inch (2.5 cm) of the cervical opening but does not cover it.  Partial placenta previa. The placenta covers part of the cervical opening.  Complete placenta previa. The placenta covers the entire cervical opening.  If the previa is marginal or partial and it is diagnosed in the first half of pregnancy, the placenta may move into a normal position as the pregnancy progresses and may no longer cover the cervix. It is important to keep all prenatal visits with your health care provider so you can be more closely monitored. What are the causes? The cause of this condition is not known. What increases the risk? This condition  is more likely to develop in women who:  Are carrying more than one baby (multiples).  Have an abnormally shaped uterus.  Have scars on the lining of the uterus.  Have had surgeries involving the uterus, such as a cesarean delivery.  Have delivered a baby before.  Have a history of placenta previa.  Have smoked or used cocaine during pregnancy.  Are age 63 or older during pregnancy.  What are the signs or symptoms? The main symptom of this condition is sudden, painless vaginal bleeding during the second half of pregnancy. The amount of bleeding can be very light at first, and it usually stops on its own. Heavier bleeding episodes may also happen. Some women with placenta previa may have no bleeding at all. How is this diagnosed?  This condition is diagnosed: ? From an ultrasound. This test uses sound waves to find where the placenta is located before you have any bleeding episodes. ? During a checkup after vaginal bleeding is noticed.  If you are diagnosed with a partial or complete previa, digital exams with fingers will generally be avoided. Your health care provider will still perform a speculum exam.  If you did not have an ultrasound during your pregnancy, placenta previa may not be diagnosed until bleeding occurs during labor. How is this treated? Treatment for this condition may include:  Decreased activity.  Bed rest at home or in the hospital.  Pelvic rest. Nothing is placed inside the vagina during pelvic rest. This means not having sex and not using tampons or douches.  A blood transfusion to replace blood that you have lost (maternal blood loss).  A cesarean delivery. This may be performed if: ? The bleeding is heavy and cannot be controlled. ? The placenta completely covers the cervix.  Medicines to stop premature labor or to help the baby's lungs to mature. This treatment may be used if you need delivery before your pregnancy is full-term.  Your treatment  will be decided based on:  How much you are bleeding, or whether the bleeding has stopped.  How far along you are in your pregnancy.  The condition of your baby.  The type of placenta previa that you have.  Follow these instructions at home:  Get plenty of rest and lessen activity as told by your health care provider.  Stay on bed rest for as long as told by your health care provider.  Do not have sex, use tampons, use a douche, or place anything inside of your vagina if your health care provider recommended pelvic rest.  Take over-the-counter and prescription medicines as told by your health care provider.  Keep all follow-up visits as told by your health care provider. This is important. Get help right away if:  You have vaginal bleeding,  even if in small amounts and even if you have no pain.  You have cramping or regular contractions.  You have pain in your abdomen or your lower back.  You have a feeling of increased pressure in your pelvis.  You have increased watery or bloody mucus from the vagina. This information is not intended to replace advice given to you by your health care provider. Make sure you discuss any questions you have with your health care provider. Document Released: 07/30/2005 Document Revised: 04/18/2016 Document Reviewed: 02/11/2016 Elsevier Interactive Patient Education  2018 [employer].  Third Trimester of Pregnancy The third trimester is from week 29 through week 42, months 7 through 9. This trimester is when your unborn baby (fetus) is growing very fast. At the end of the ninth month, the unborn baby is about 20 inches in length. It weighs about 6-10 pounds. Follow these instructions at home:  Avoid all smoking, herbs, and alcohol. Avoid drugs not approved by your doctor.  Do not use any tobacco products, including cigarettes, chewing tobacco, and electronic cigarettes. If you need help quitting, ask your doctor. You may get counseling or  other support to help you quit.  Only take medicine as told by your doctor. Some medicines are safe and some are not during pregnancy.  Exercise only as told by your doctor. Stop exercising if you start having cramps.  Eat regular, healthy meals.  Wear a good support bra if your breasts are tender.  Do not use hot tubs, steam rooms, or saunas.  Wear your seat belt when driving.  Avoid raw meat, uncooked cheese, and liter boxes and soil used by cats.  Take your prenatal vitamins.  Take 1500-2000 milligrams of calcium daily starting at the 20th week of pregnancy until you deliver your baby.  Try taking medicine that helps you poop (stool softener) as needed, and if your doctor approves. Eat more fiber by eating fresh fruit, vegetables, and whole grains. Drink enough fluids to keep your pee (urine) clear or pale yellow.  Take warm water baths (sitz baths) to soothe pain or discomfort caused by hemorrhoids. Use hemorrhoid cream if your doctor approves.  If you have puffy, bulging veins (varicose veins), wear support hose. Raise (elevate) your feet for 15 minutes, 3-4 times a day. Limit salt in your diet.  Avoid heavy lifting, wear low heels, and sit up straight.  Rest with your legs raised if you have leg cramps or low back pain.  Visit your dentist if you have not gone during your pregnancy. Use a soft toothbrush to brush your teeth. Be gentle when you floss.  You can have sex (intercourse) unless your doctor tells you not to.  Do not travel far distances unless you must. Only do so with your doctor's approval.  Take prenatal classes.  Practice driving to the hospital.  Pack your hospital bag.  Prepare the baby's room.  Go to your doctor visits. Get help if:  You are not sure if you are in labor or if your water has broken.  You are dizzy.  You have mild cramps or pressure in your lower belly (abdominal).  You have a nagging pain in your belly area.  You continue  to feel sick to your stomach (nauseous), throw up (vomit), or have watery poop (diarrhea).  You have bad smelling fluid coming from your vagina.  You have pain with peeing (urination). Get help right away if:  You have a fever.  You are leaking fluid  from your vagina.  You are spotting or bleeding from your vagina.  You have severe belly cramping or pain.  You lose or gain weight rapidly.  You have trouble catching your breath and have chest pain.  You notice sudden or extreme puffiness (swelling) of your face, hands, ankles, feet, or legs.  You have not felt the baby move in over an hour.  You have severe headaches that do not go away with medicine.  You have vision changes. This information is not intended to replace advice given to you by your health care provider. Make sure you discuss any questions you have with your health care provider. Document Released: 10/24/2009 Document Revised: 01/05/2016 Document Reviewed: 09/30/2012 Elsevier Interactive Patient Education  2017 Elsevier Inc.  Vaginitis Vaginitis is an inflammation of the vagina. It can happen when the normal bacteria and yeast in the vagina grow too much. There are different types. Treatment will depend on the type you have. Follow these instructions at home:  Take all medicines as told by your doctor.  Keep your vagina area clean and dry. Avoid soap. Rinse the area with water.  Avoid washing and cleaning out the vagina (douching).  Do not use tampons or have sex (intercourse) until your treatment is done.  Wipe from front to back after going to the restroom.  Wear cotton underwear.  Avoid wearing underwear while you sleep until your vaginitis is gone.  Avoid tight pants. Avoid underwear or nylons without a cotton panel.  Take off wet clothing (such as a bathing suit) as soon as you can.  Use mild, unscented products. Avoid fabric softeners and scented: ? Feminine sprays. ? Laundry  detergents. ? Tampons. ? Soaps or bubble baths.  Practice safe sex and use condoms. Get help right away if:  You have belly (abdominal) pain.  You have a fever or lasting symptoms for more than 2-3 days.  You have a fever and your symptoms suddenly get worse. This information is not intended to replace advice given to you by your health care provider. Make sure you discuss any questions you have with your health care provider. Document Released: 10/26/2008 Document Revised: 01/05/2016 Document Reviewed: 01/10/2012 Elsevier Interactive Patient Education  2017 [employer].

## 2017-11-07 NOTE — Progress Notes (Signed)
   PRENATAL VISIT NOTE  Subjective:  Diane [name] is a 27 y.o. G3P2002 at 7273w5d being seen today for ongoing prenatal care.  She is currently monitored for the following issues for this low-risk pregnancy and has Dysmenorrhea; Dyspareunia in female; Supervision of normal pregnancy, antepartum; and Low-lying placenta on their problem list.  Patient reports no bleeding, no contractions, no cramping, no leaking and vaginal irritation.  Contractions: Irregular. Vag. Bleeding: None.  Movement: Present. Denies leaking of fluid.   The following portions of the patient's history were reviewed and updated as appropriate: allergies, current medications, past family history, past medical history, past social history, past surgical history and problem list. Problem list updated.  Objective:   Vitals:   11/07/17 0903  BP: 110/72  Pulse: (!) 112  Weight: 175 lb 8 oz (79.6 kg)    Fetal Status: Fetal Heart Rate (bpm): 146; doppler; +FM noted Fundal Height: 36 cm Movement: Present     General:  Alert, oriented and cooperative. Patient is in no acute distress.  Skin: Skin is warm and dry. No rash noted.   Cardiovascular: Normal heart rate noted  Respiratory: Normal respiratory effort, no problems with respiration noted  Abdomen: Soft, gravid, appropriate for gestational age.  Pain/Pressure: Present     Pelvic: Cervical exam deferred        Extremities: Normal range of motion.  Edema: Trace  Mental Status:  Normal mood and affect. Normal behavior. Normal judgment and thought content.   Assessment and Plan:  Pregnancy: G3P2002 at 4073w5d  1. Supervision of other normal pregnancy, antepartum      Doing well  2. Low-lying placenta     Has TVUS ordered for today  3. Yeast vaginitis      - fluconazole (DIFLUCAN) 150 MG tablet; Take 1 tablet (150 mg total) by mouth once for 1 dose.  Dispense: 1 tablet; Refill: 0 - terconazole (TERAZOL 3) 0.8 % vaginal cream; Place 1 applicator vaginally at  bedtime.  Dispense: 20 g; Refill: 0  Preterm labor symptoms and general obstetric precautions including but not limited to vaginal bleeding, contractions, leaking of fluid and fetal movement were reviewed in detail with the patient. Please refer to After Visit Summary for other counseling recommendations.  Return in about 1 week (around 11/14/2017) for Needs to see FP MD here.   Roe Coombsachelle A Omair Dettmer, CNM

## 2017-11-09 ENCOUNTER — Other Ambulatory Visit: Payer: Self-pay

## 2017-11-09 ENCOUNTER — Inpatient Hospital Stay (HOSPITAL_COMMUNITY)
Admission: AD | Admit: 2017-11-09 | Discharge: 2017-11-09 | Disposition: A | Discharge status: Home / Self Care | Payer: Medicaid Other | Source: Ambulatory Visit | Attending: Obstetrics & Gynecology | Admitting: Obstetrics & Gynecology

## 2017-11-09 ENCOUNTER — Encounter (HOSPITAL_COMMUNITY): Payer: Self-pay

## 2017-11-09 DIAGNOSIS — Z3A37 37 weeks gestation of pregnancy: Secondary | ICD-10-CM | POA: Insufficient documentation

## 2017-11-09 DIAGNOSIS — Z881 Allergy status to other antibiotic agents status: Secondary | ICD-10-CM | POA: Insufficient documentation

## 2017-11-09 DIAGNOSIS — O9989 Other specified diseases and conditions complicating pregnancy, childbirth and the puerperium: Secondary | ICD-10-CM

## 2017-11-09 DIAGNOSIS — M549 Dorsalgia, unspecified: Secondary | ICD-10-CM | POA: Diagnosis not present

## 2017-11-09 DIAGNOSIS — O26893 Other specified pregnancy related conditions, third trimester: Secondary | ICD-10-CM | POA: Diagnosis not present

## 2017-11-09 DIAGNOSIS — Z3480 Encounter for supervision of other normal pregnancy, unspecified trimester: Secondary | ICD-10-CM

## 2017-11-09 NOTE — Discharge Instructions (Signed)
Back Injury Prevention Back injuries can be very painful. They can also be difficult to heal. After having one back injury, you are more likely to injure your back again. It is important to learn how to avoid injuring or re-injuring your back. The following tips can help you to prevent a back injury. What should I know about physical fitness?  Exercise for 30 minutes per day on most days of the week or as directed by your health care provider. Make sure to: ? Do aerobic exercises, such as walking, jogging, biking, or swimming. ? Do exercises that increase balance and strength, such as tai chi and yoga. These can decrease your risk of falling and injuring your back. ? Do stretching exercises to help with flexibility. ? Try to develop strong abdominal muscles. Your abdominal muscles provide a lot of the support that is needed by your back.  Maintain a healthy weight. This helps to decrease your risk of a back injury. What should I know about my diet?  Talk with your health care provider about your overall diet. Take supplements and vitamins only as directed by your health care provider.  Talk with your health care provider about how much calcium and vitamin D you need each day. These nutrients help to prevent weakening of the bones (osteoporosis). Osteoporosis can cause broken (fractured) bones, which lead to back pain.  Include good sources of calcium in your diet, such as dairy products, green leafy vegetables, and products that have had calcium added to them (fortified).  Include good sources of vitamin D in your diet, such as milk and foods that are fortified with vitamin D. What should I know about my posture?  Sit up straight and stand up straight. Avoid leaning forward when you sit or hunching over when you stand.  Choose chairs that have good low-back (lumbar) support.  If you work at a desk, sit close to it so you do not need to lean over. Keep your chin tucked in. Keep your neck  drawn back, and keep your elbows bent at a right angle. Your arms should look like the letter "L."  Sit high and close to the steering wheel when you drive. Add a lumbar support to your car seat, if needed.  Avoid sitting or standing in one position for very long. Take breaks to get up, stretch, and walk around at least one time every hour. Take breaks every hour if you are driving for long periods of time.  Sleep on your side with your knees slightly bent, or sleep on your back with a pillow under your knees. Do not lie on the front of your body to sleep. What should I know about lifting, twisting, and reaching? Lifting and Heavy Lifting   Avoid heavy lifting, especially repetitive heavy lifting. If you must do heavy lifting: ? Stretch before lifting. ? Work slowly. ? Rest between lifts. ? Use a tool such as a cart or a dolly to move objects if one is available. ? Make several small trips instead of carrying one heavy load. ? Ask for help when you need it, especially when moving big objects.  Follow these steps when lifting: ? Stand with your feet shoulder-width apart. ? Get as close to the object as you can. Do not try to pick up a heavy object that is far from your body. ? Use handles or lifting straps if they are available. ? Bend at your knees. Squat down, but keep your heels off the  floor. ? Keep your shoulders pulled back, your chin tucked in, and your back straight. ? Lift the object slowly while you tighten the muscles in your legs, abdomen, and buttocks. Keep the object as close to the center of your body as possible.  Follow these steps when putting down a heavy load: ? Stand with your feet shoulder-width apart. ? Lower the object slowly while you tighten the muscles in your legs, abdomen, and buttocks. Keep the object as close to the center of your body as possible. ? Keep your shoulders pulled back, your chin tucked in, and your back straight. ? Bend at your knees. Squat  down, but keep your heels off the floor. ? Use handles or lifting straps if they are available. Twisting and Reaching  Avoid lifting heavy objects above your waist.  Do not twist at your waist while you are lifting or carrying a load. If you need to turn, move your feet.  Do not bend over without bending at your knees.  Avoid reaching over your head, across a table, or for an object on a high surface. What are some other tips?  Avoid wet floors and icy ground. Keep sidewalks clear of ice to prevent falls.  Do not sleep on a mattress that is too soft or too hard.  Keep items that are used frequently within easy reach.  Put heavier objects on shelves at waist level, and put lighter objects on lower or higher shelves.  Find ways to decrease your stress, such as exercise, massage, or relaxation techniques. Stress can build up in your muscles. Tense muscles are more vulnerable to injury.  Talk with your health care provider if you feel anxious or depressed. These conditions can make back pain worse.  Wear flat heel shoes with cushioned soles.  Avoid sudden movements.  Use both shoulder straps when carrying a backpack.  Do not use any tobacco products, including cigarettes, chewing tobacco, or electronic cigarettes. If you need help quitting, ask your health care provider. This information is not intended to replace advice given to you by your health care provider. Make sure you discuss any questions you have with your health care provider. Document Released: 09/06/2004 Document Revised: 01/05/2016 Document Reviewed: 08/03/2014 Elsevier Interactive Patient Education  2018 [employer].   Back Pain, Adult Back pain is very common. The pain often gets better over time. The cause of back pain is usually not dangerous. Most people can learn to manage their back pain on their own. Follow these instructions at home: Watch your back pain for any changes. The following actions may help to  lessen any pain you are feeling:  Stay active. Start with short walks on flat ground if you can. Try to walk farther each day.  Exercise regularly as told by your doctor. Exercise helps your back heal faster. It also helps avoid future injury by keeping your muscles strong and flexible.  Do not sit, drive, or stand in one place for more than 30 minutes.  Do not stay in bed. Resting more than 1-2 days can slow down your recovery.  Be careful when you bend or lift an object. Use good form when lifting: ? Bend at your knees. ? Keep the object close to your body. ? Do not twist.  Sleep on a firm mattress. Lie on your side, and bend your knees. If you lie on your back, put a pillow under your knees.  Take medicines only as told by your doctor.  Put  ice on the injured area. ? Put ice in a plastic bag. ? Place a towel between your skin and the bag. ? Leave the ice on for 20 minutes, 2-3 times a day for the first 2-3 days. After that, you can switch between ice and heat packs.  Avoid feeling anxious or stressed. Find good ways to deal with stress, such as exercise.  Maintain a healthy weight. Extra weight puts stress on your back.  Contact a doctor if:  You have pain that does not go away with rest or medicine.  You have worsening pain that goes down into your legs or buttocks.  You have pain that does not get better in one week.  You have pain at night.  You lose weight.  You have a fever or chills. Get help right away if:  You cannot control when you poop (bowel movement) or pee (urinate).  Your arms or legs feel weak.  Your arms or legs lose feeling (numbness).  You feel sick to your stomach (nauseous) or throw up (vomit).  You have belly (abdominal) pain.  You feel like you may pass out (faint). This information is not intended to replace advice given to you by your health care provider. Make sure you discuss any questions you have with your health care  provider. Document Released: 01/16/2008 Document Revised: 01/05/2016 Document Reviewed: 12/01/2013 Elsevier Interactive Patient Education  [employer].

## 2017-11-09 NOTE — MAU Provider Note (Signed)
History    G3P2002 @ 37 wks in with c/o back pain that has been goin on since 0:30 this morning. Denies ROM or vag bleeding. CSN: [id number]  Arrival date & time 11/09/17  1258   None     Chief Complaint  Patient presents with  . Labor Eval    HPI  Past Medical History:  Diagnosis Date  . Anemia    pt's states not anemic  . Headache    Migraines - not on meds    Past Surgical History:  Procedure Laterality Date  . LAPAROSCOPIC LYSIS OF ADHESIONS N/A 07/30/2016   Procedure: LAPAROSCOPIC LYSIS OF ADHESIONS;  Surgeon: [place] Bingharlie Pickens, MD;  Location: [facility];  Service: Gynecology;  Laterality: N/A;  . LAPAROSCOPY N/A 07/30/2016   Procedure: LAPAROSCOPY DIAGNOSTIC;  Surgeon: [employer] Bingharlie Pickens, MD;  Location: [facility];  Service: Gynecology;  Laterality: N/A;  . WISDOM TOOTH EXTRACTION      Family History  Problem Relation Age of Onset  . Diabetes Mother   . Diabetes Maternal Grandmother     Social History   Tobacco Use  . Smoking status: Never Smoker  . Smokeless tobacco: Never Used  Substance Use Topics  . Alcohol use: No  . Drug use: No    OB History    Gravida  3   Para  2   Term  2   Preterm      AB      Living  2     SAB      TAB      Ectopic      Multiple      Live Births  2        Obstetric Comments  SVD x 2        Review of Systems  Constitutional: Negative.   Eyes: Negative.   Respiratory: Negative.   Cardiovascular: Negative.   Gastrointestinal: Negative.   Endocrine: Negative.   Genitourinary: Negative.   Musculoskeletal: Positive for back pain.  Skin: Negative.   Allergic/Immunologic: Negative.   Neurological: Negative.   Hematological: Negative.   Psychiatric/Behavioral: Negative.     Allergies  Flagyl [metronidazole]  Home Medications    LMP 02/23/2017   Physical Exam  Constitutional: She is oriented to person, place, and time. She appears well-developed and well-nourished.  HENT:  Head: Normocephalic.   Neck: Normal range of motion.  Cardiovascular: Normal rate.  Pulmonary/Chest: Effort normal.  Abdominal: Soft. Bowel sounds are normal.  Genitourinary: Vagina normal and uterus normal.  Musculoskeletal: Normal range of motion.  Neurological: She is alert and oriented to person, place, and time. She has normal reflexes.  Skin: Skin is warm and dry.  Psychiatric: She has a normal mood and affect. Her behavior is normal. Judgment and thought content normal.    MAU Course  Procedures (including critical care time)  Labs Reviewed - No data to display No results found.   1. Back pain affecting pregnancy in third trimester   2. Supervision of other normal pregnancy, antepartum       MDM  FHR pattern reassuring with 15x15 accels no decels. No uc's. SVE cl/th/post/high.Will d/c home not in labor.

## 2017-11-09 NOTE — MAU Note (Addendum)
G3P2  @ [redacted] wksga with low lying placenta. Pt here for r/o labor. Back pain started this morning around 0830 with abd. Pain. Denies bleed or LOF. Vaginal discharge noted.   Provider at bs assessing. Made aware of low lying placenta. SVE closed  1340: D/C orders received.   1345: Labor precaution d/c instructions given with pt understanding. Pt left unit via ambulatory with her mother.

## 2017-11-12 ENCOUNTER — Other Ambulatory Visit: Payer: Self-pay | Admitting: Certified Nurse Midwife

## 2017-11-12 DIAGNOSIS — Z3480 Encounter for supervision of other normal pregnancy, unspecified trimester: Secondary | ICD-10-CM

## 2017-11-14 ENCOUNTER — Encounter: Payer: Self-pay | Admitting: Obstetrics and Gynecology

## 2017-11-14 ENCOUNTER — Ambulatory Visit (INDEPENDENT_AMBULATORY_CARE_PROVIDER_SITE_OTHER): Payer: Medicaid Other | Admitting: Obstetrics and Gynecology

## 2017-11-14 VITALS — BP 123/74 | HR 108 | Wt 178.0 lb

## 2017-11-14 DIAGNOSIS — O4440 Low lying placenta NOS or without hemorrhage, unspecified trimester: Secondary | ICD-10-CM

## 2017-11-14 DIAGNOSIS — Z3480 Encounter for supervision of other normal pregnancy, unspecified trimester: Secondary | ICD-10-CM

## 2017-11-14 NOTE — Progress Notes (Signed)
   PRENATAL VISIT NOTE  Subjective:  Diane [name] is a 27 y.o. G3P2002 at 540w5d being seen today for ongoing prenatal care.  She is currently monitored for the following issues for this low-risk pregnancy and has Dysmenorrhea; Dyspareunia in female; Supervision of normal pregnancy, antepartum; and Low-lying placenta on their problem list.  Patient reports no complaints.  Contractions: Irritability. Vag. Bleeding: None.  Movement: Present. Denies leaking of fluid.   The following portions of the patient's history were reviewed and updated as appropriate: allergies, current medications, past family history, past medical history, past social history, past surgical history and problem list. Problem list updated.  Objective:   Vitals:   11/14/17 1549  BP: 123/74  Pulse: (!) 108  Weight: 178 lb (80.7 kg)    Fetal Status: Fetal Heart Rate (bpm): 140 Fundal Height: 37 cm Movement: Present     General:  Alert, oriented and cooperative. Patient is in no acute distress.  Skin: Skin is warm and dry. No rash noted.   Cardiovascular: Normal heart rate noted  Respiratory: Normal respiratory effort, no problems with respiration noted  Abdomen: Soft, gravid, appropriate for gestational age.  Pain/Pressure: Present     Pelvic: Cervical exam deferred        Extremities: Normal range of motion.     Mental Status: Normal mood and affect. Normal behavior. Normal judgment and thought content.   Assessment and Plan:  Pregnancy: G3P2002 at 6940w5d  1. Supervision of other normal pregnancy, antepartum Patient is doing well without complaints Reviewed 3/29 ultrasound- patient desires SVD. Will schedule IOL at 39 weeks as recommended by MFM  Term labor symptoms and general obstetric precautions including but not limited to vaginal bleeding, contractions, leaking of fluid and fetal movement were reviewed in detail with the patient. Please refer to After Visit Summary for other counseling  recommendations.  Return in about 1 week (around 11/21/2017) for ROB.  Future Appointments  Date Time Provider Department Center  11/21/2017 10:30 AM Bronte Sabado, Gigi GinPeggy, MD [facility] None    Catalina AntiguaPeggy Jaishawn Witzke, MD

## 2017-11-15 ENCOUNTER — Inpatient Hospital Stay (HOSPITAL_COMMUNITY)
Admission: AD | Admit: 2017-11-15 | Discharge: 2017-11-15 | Disposition: A | Discharge status: Home / Self Care | Payer: Medicaid Other | Source: Ambulatory Visit | Attending: Obstetrics and Gynecology | Admitting: Obstetrics and Gynecology

## 2017-11-15 ENCOUNTER — Encounter (HOSPITAL_COMMUNITY): Payer: Self-pay | Admitting: *Deleted

## 2017-11-15 DIAGNOSIS — Z3A37 37 weeks gestation of pregnancy: Secondary | ICD-10-CM | POA: Insufficient documentation

## 2017-11-15 DIAGNOSIS — O471 False labor at or after 37 completed weeks of gestation: Secondary | ICD-10-CM | POA: Diagnosis not present

## 2017-11-15 DIAGNOSIS — O4440 Low lying placenta NOS or without hemorrhage, unspecified trimester: Secondary | ICD-10-CM

## 2017-11-15 DIAGNOSIS — O4443 Low lying placenta NOS or without hemorrhage, third trimester: Secondary | ICD-10-CM | POA: Insufficient documentation

## 2017-11-15 DIAGNOSIS — O479 False labor, unspecified: Secondary | ICD-10-CM

## 2017-11-15 NOTE — Discharge Instructions (Signed)
Braxton Hicks Contractions °Contractions of the uterus can occur throughout pregnancy, but they are not always a sign that you are in labor. You may have practice contractions called Braxton Hicks contractions. These false labor contractions are sometimes confused with true labor. °What are Braxton Hicks contractions? °Braxton Hicks contractions are tightening movements that occur in the muscles of the uterus before labor. Unlike true labor contractions, these contractions do not result in opening (dilation) and thinning of the cervix. Toward the end of pregnancy (32-34 weeks), Braxton Hicks contractions can happen more often and may become stronger. These contractions are sometimes difficult to tell apart from true labor because they can be very uncomfortable. You should not feel embarrassed if you go to the hospital with false labor. °Sometimes, the only way to tell if you are in true labor is for your health care provider to look for changes in the cervix. The health care provider will do a physical exam and may monitor your contractions. If you are not in true labor, the exam should show that your cervix is not dilating and your water has not broken. °If there are other health problems associated with your pregnancy, it is completely safe for you to be sent home with false labor. You may continue to have Braxton Hicks contractions until you go into true labor. °How to tell the difference between true labor and false labor °True labor °· Contractions last 30-70 seconds. °· Contractions become very regular. °· Discomfort is usually felt in the top of the uterus, and it spreads to the lower abdomen and low back. °· Contractions do not go away with walking. °· Contractions usually become more intense and increase in frequency. °· The cervix dilates and gets thinner. °False labor °· Contractions are usually shorter and not as strong as true labor contractions. °· Contractions are usually irregular. °· Contractions  are often felt in the front of the lower abdomen and in the groin. °· Contractions may go away when you walk around or change positions while lying down. °· Contractions get weaker and are shorter-lasting as time goes on. °· The cervix usually does not dilate or become thin. °Follow these instructions at home: °· Take over-the-counter and prescription medicines only as told by your health care provider. °· Keep up with your usual exercises and follow other instructions from your health care provider. °· Eat and drink lightly if you think you are going into labor. °· If Braxton Hicks contractions are making you uncomfortable: °? Change your position from lying down or resting to walking, or change from walking to resting. °? Sit and rest in a tub of warm water. °? Drink enough fluid to keep your urine pale yellow. Dehydration may cause these contractions. °? Do slow and deep breathing several times an hour. °· Keep all follow-up prenatal visits as told by your health care provider. This is important. °Contact a health care provider if: °· You have a fever. °· You have continuous pain in your abdomen. °Get help right away if: °· Your contractions become stronger, more regular, and closer together. °· You have fluid leaking or gushing from your vagina. °· You pass blood-tinged mucus (bloody show). °· You have bleeding from your vagina. °· You have low back pain that you never had before. °· You feel your baby’s head pushing down and causing pelvic pressure. °· Your baby is not moving inside you as much as it used to. °Summary °· Contractions that occur before labor are called Braxton   Hicks contractions, false labor, or practice contractions. °· Braxton Hicks contractions are usually shorter, weaker, farther apart, and less regular than true labor contractions. True labor contractions usually become progressively stronger and regular and they become more frequent. °· Manage discomfort from Braxton Hicks contractions by  changing position, resting in a warm bath, drinking plenty of water, or practicing deep breathing. °This information is not intended to replace advice given to you by your health care provider. Make sure you discuss any questions you have with your health care provider. °Document Released: 12/13/2016 Document Revised: 12/13/2016 Document Reviewed: 12/13/2016 °Elsevier Interactive Patient Education © 2018 Elsevier Inc. ° ° °Pelvic Rest °Pelvic rest may be recommended if: °· Your placenta is partially or completely covering the opening of your cervix (placenta previa). °· There is bleeding between the wall of the uterus and the amniotic sac in the first trimester of pregnancy (subchorionic hemorrhage). °· You went into labor too early (preterm labor). ° °Based on your overall health and the health of your baby, your health care provider will decide if pelvic rest is right for you. °How do I rest my pelvis? °For as long as told by your health care provider: °· Do not have sex, sexual stimulation, or an orgasm. °· Do not use tampons. Do not douche. Do not put anything in your vagina. °· Do not lift anything that is heavier than 10 lb (4.5 kg). °· Avoid activities that take a lot of effort (are strenuous). °· Avoid any activity in which your pelvic muscles could become strained. ° °When should I seek medical care? °Seek medical care if you have: °· Cramping pain in your lower abdomen. °· Vaginal discharge. °· A low, dull backache. °· Regular contractions. °· Uterine tightening. ° °When should I seek immediate medical care? °Seek immediate medical care if: °· You have vaginal bleeding and you are pregnant. ° °This information is not intended to replace advice given to you by your health care provider. Make sure you discuss any questions you have with your health care provider. °Document Released: 11/24/2010 Document Revised: 01/05/2016 Document Reviewed: 01/31/2015 °Elsevier Interactive Patient Education © 2018 Elsevier  Inc. ° °

## 2017-11-15 NOTE — MAU Provider Note (Signed)
History     CSN: [id number]  Arrival date and time: 11/15/17 0827   None     Chief Complaint  Patient presents with  . Contractions   HPI   [name] is a 27 y.o. female G3P2002 @ [redacted]w[redacted]d here in MAU with contractions. She has a low lying placenta with plans for a vaginal delivery. No bleeding, no leaking of fluid. Contractions irregular and started last night. + fetal movement   OB History    Gravida  3   Para  2   Term  2   Preterm      AB      Living  2     SAB      TAB      Ectopic      Multiple      Live Births  2        Obstetric Comments  SVD x 2        Past Medical History:  Diagnosis Date  . Anemia    pt's states not anemic  . Headache    Migraines - not on meds    Past Surgical History:  Procedure Laterality Date  . LAPAROSCOPIC LYSIS OF ADHESIONS N/A 07/30/2016   Procedure: LAPAROSCOPIC LYSIS OF ADHESIONS;  Surgeon: [place] Bing, MD;  Location: [facility];  Service: Gynecology;  Laterality: N/A;  . LAPAROSCOPY N/A 07/30/2016   Procedure: LAPAROSCOPY DIAGNOSTIC;  Surgeon: [place] Bing, MD;  Location: [facility];  Service: Gynecology;  Laterality: N/A;  . WISDOM TOOTH EXTRACTION      Family History  Problem Relation Age of Onset  . Diabetes Mother   . Diabetes Maternal Grandmother     Social History   Tobacco Use  . Smoking status: Never Smoker  . Smokeless tobacco: Never Used  Substance Use Topics  . Alcohol use: No  . Drug use: No    Allergies:  Allergies  Allergen Reactions  . Flagyl [Metronidazole] Itching    Medications Prior to Admission  Medication Sig Dispense Refill Last Dose  . Elastic Bandages & Supports (COMFORT FIT MATERNITY SUPP SM) MISC Wear as directed. (Patient not taking: Reported on 11/14/2017) 1 each 0 Not Taking  . terconazole (TERAZOL 3) 0.8 % vaginal cream Place 1 applicator vaginally at bedtime. (Patient not taking: Reported on 11/14/2017) 20 g 0 Not Taking   No results found for this or  any previous visit (from the past 48 hour(s)).  Review of Systems  Constitutional: Negative for fever.  Gastrointestinal: Positive for abdominal pain. Negative for nausea and vomiting.  Genitourinary: Negative for vaginal bleeding and vaginal discharge.   Physical Exam   Blood pressure 109/63, pulse 99, temperature 98.8 F (37.1 C), temperature source Oral, resp. rate 16, weight 179 lb (81.2 kg), last menstrual period 02/23/2017, SpO2 99 %.  Physical Exam  Constitutional: She is oriented to person, place, and time. She appears well-developed and well-nourished.  Non-toxic appearance. She does not have a sickly appearance. She does not appear ill. No distress.  HENT:  Head: Normocephalic.  Eyes: Pupils are equal, round, and reactive to light.  Genitourinary:  Genitourinary Comments: Cervix: closed, thick, posterior. No blood noted   Musculoskeletal: Normal range of motion.  Neurological: She is alert and oriented to person, place, and time.  Skin: Skin is warm. She is not diaphoretic.  Psychiatric: Her behavior is normal.   Fetal Tracing: Baseline: 135 bpm Variability: Moderate  Accelerations: 15x15 Decelerations: None Toco: Occasional.  MAU Course  Procedures  None  MDM  Discussed patient with Dr. Alysia PennaErvin who reviewed prenatal records and MFM recommendations.   Assessment and Plan   A:  1. False labor   2. Low-lying placenta   3. Braxton Hicks contractions     P:  Discharge home with strict return precautions Pelvic rest Induction scheduled Return to MAU if symptoms worsen Labor precautions  Latoi Giraldo, Harolyn RutherfordJennifer I, NP 11/15/2017 2:02 PM

## 2017-11-15 NOTE — MAU Note (Signed)
Thinks she is having contractions, they were back to back; no longer close like they were.  Was at dr's yesterday, was told if it continues to happen to come. No bleeding or leaking.  Hx of low lying placenta, has not been checked.

## 2017-11-18 ENCOUNTER — Encounter (HOSPITAL_COMMUNITY): Payer: Self-pay | Admitting: *Deleted

## 2017-11-18 ENCOUNTER — Telehealth (HOSPITAL_COMMUNITY): Payer: Self-pay | Admitting: *Deleted

## 2017-11-18 NOTE — Telephone Encounter (Signed)
Preadmission screen  

## 2017-11-21 ENCOUNTER — Encounter: Payer: Self-pay | Admitting: Obstetrics and Gynecology

## 2017-11-21 ENCOUNTER — Ambulatory Visit (INDEPENDENT_AMBULATORY_CARE_PROVIDER_SITE_OTHER): Payer: Medicaid Other | Admitting: Obstetrics and Gynecology

## 2017-11-21 VITALS — BP 119/69 | HR 101 | Wt 179.7 lb

## 2017-11-21 DIAGNOSIS — Z3483 Encounter for supervision of other normal pregnancy, third trimester: Secondary | ICD-10-CM

## 2017-11-21 DIAGNOSIS — O4443 Low lying placenta NOS or without hemorrhage, third trimester: Secondary | ICD-10-CM

## 2017-11-21 DIAGNOSIS — Z3480 Encounter for supervision of other normal pregnancy, unspecified trimester: Secondary | ICD-10-CM

## 2017-11-21 DIAGNOSIS — O4440 Low lying placenta NOS or without hemorrhage, unspecified trimester: Secondary | ICD-10-CM

## 2017-11-21 NOTE — Progress Notes (Signed)
Patient reports good fetal movement with irregular contractions.  

## 2017-11-21 NOTE — Progress Notes (Signed)
   PRENATAL VISIT NOTE  Subjective:  Diane CapersBrittney M [name] is a 27 y.o. G3P2002 at 4187w5d being seen today for ongoing prenatal care.  She is currently monitored for the following issues for this low-risk pregnancy and has Dysmenorrhea; Dyspareunia in female; Supervision of normal pregnancy, antepartum; and Low-lying placenta on their problem list.  Patient reports no complaints.  Contractions: Irregular. Vag. Bleeding: None.  Movement: Present. Denies leaking of fluid.   The following portions of the patient's history were reviewed and updated as appropriate: allergies, current medications, past family history, past medical history, past social history, past surgical history and problem list. Problem list updated.  Objective:   Vitals:   11/21/17 1034  BP: 119/69  Pulse: (!) 101  Weight: 179 lb 11.2 oz (81.5 kg)    Fetal Status: Fetal Heart Rate (bpm): 134 Fundal Height: 38 cm Movement: Present     General:  Alert, oriented and cooperative. Patient is in no acute distress.  Skin: Skin is warm and dry. No rash noted.   Cardiovascular: Normal heart rate noted  Respiratory: Normal respiratory effort, no problems with respiration noted  Abdomen: Soft, gravid, appropriate for gestational age.  Pain/Pressure: Present     Pelvic: Cervical exam deferred        Extremities: Normal range of motion.  Edema: Trace  Mental Status: Normal mood and affect. Normal behavior. Normal judgment and thought content.   Assessment and Plan:  Pregnancy: G3P2002 at 3287w5d  1. Supervision of other normal pregnancy, antepartum Patient is doing well without complaints  2. Low-lying placenta 1 cm from os- per MFM may have vaginal delivery Scheduled for IOL at 39 weeks  Term labor symptoms and general obstetric precautions including but not limited to vaginal bleeding, contractions, leaking of fluid and fetal movement were reviewed in detail with the patient. Please refer to After Visit Summary for other  counseling recommendations.  Return in about 1 month (around 12/19/2017) for postpartum visit.  Future Appointments  Date Time Provider Department Center  11/23/2017  7:30 AM [facility] None  12/16/2017 10:30 AM Kourtni Stineman, Gigi GinPeggy, MD [facility] None    Catalina AntiguaPeggy Kennita Pavlovich, MD

## 2017-11-23 ENCOUNTER — Inpatient Hospital Stay (HOSPITAL_COMMUNITY)
Admission: RE | Admit: 2017-11-23 | Discharge: 2017-11-25 | DRG: 807 | Disposition: A | Discharge status: Home / Self Care | Payer: Medicaid Other | Source: Ambulatory Visit | Attending: Obstetrics & Gynecology | Admitting: Obstetrics & Gynecology

## 2017-11-23 ENCOUNTER — Encounter (HOSPITAL_COMMUNITY): Payer: Self-pay

## 2017-11-23 DIAGNOSIS — Z3A39 39 weeks gestation of pregnancy: Secondary | ICD-10-CM | POA: Diagnosis not present

## 2017-11-23 DIAGNOSIS — O4443 Low lying placenta NOS or without hemorrhage, third trimester: Principal | ICD-10-CM | POA: Diagnosis present

## 2017-11-23 DIAGNOSIS — Z3490 Encounter for supervision of normal pregnancy, unspecified, unspecified trimester: Secondary | ICD-10-CM | POA: Diagnosis present

## 2017-11-23 DIAGNOSIS — Z3480 Encounter for supervision of other normal pregnancy, unspecified trimester: Secondary | ICD-10-CM

## 2017-11-23 LAB — CBC
HCT: 33.1 % — ABNORMAL LOW (ref 36.0–46.0)
Hemoglobin: 11 g/dL — ABNORMAL LOW (ref 12.0–15.0)
MCH: 29.3 pg (ref 26.0–34.0)
MCHC: 33.2 g/dL (ref 30.0–36.0)
MCV: 88.3 fL (ref 78.0–100.0)
Platelets: 288 10*3/uL (ref 150–400)
RBC: 3.75 MIL/uL — ABNORMAL LOW (ref 3.87–5.11)
RDW: 14.1 % (ref 11.5–15.5)
WBC: 12.8 10*3/uL — ABNORMAL HIGH (ref 4.0–10.5)

## 2017-11-23 LAB — TYPE AND SCREEN
ABO/RH(D): B POS
Antibody Screen: NEGATIVE

## 2017-11-23 MED ORDER — OXYTOCIN BOLUS FROM INFUSION
500.0000 mL | Freq: Once | INTRAVENOUS | Status: AC
  Administered 2017-11-24: 500 mL via INTRAVENOUS

## 2017-11-23 MED ORDER — ONDANSETRON HCL 4 MG/2ML IJ SOLN: 4.0000 mg | Freq: Four times a day (QID) | INTRAMUSCULAR | Status: DC | PRN

## 2017-11-23 MED ORDER — LIDOCAINE HCL (PF) 1 % IJ SOLN
30.0000 mL | INTRAMUSCULAR | Status: DC | PRN
  Filled 2017-11-23: qty 30

## 2017-11-23 MED ORDER — ACETAMINOPHEN 325 MG PO TABS: 650.0000 mg | ORAL_TABLET | ORAL | Status: DC | PRN

## 2017-11-23 MED ORDER — LACTATED RINGERS IV SOLN
INTRAVENOUS | Status: DC
  Administered 2017-11-23 – 2017-11-24 (×4): via INTRAVENOUS

## 2017-11-23 MED ORDER — OXYTOCIN 40 UNITS IN LACTATED RINGERS INFUSION - SIMPLE MED
1.0000 m[IU]/min | INTRAVENOUS | Status: DC
  Administered 2017-11-23: 1 m[IU]/min via INTRAVENOUS
  Filled 2017-11-23: qty 1000

## 2017-11-23 MED ORDER — TERBUTALINE SULFATE 1 MG/ML IJ SOLN
0.2500 mg | Freq: Once | INTRAMUSCULAR | Status: DC | PRN
  Filled 2017-11-23: qty 1

## 2017-11-23 MED ORDER — OXYCODONE-ACETAMINOPHEN 5-325 MG PO TABS
1.0000 | ORAL_TABLET | ORAL | Status: DC | PRN
Start: 2017-11-23 — End: 2017-11-24

## 2017-11-23 MED ORDER — MISOPROSTOL 50MCG HALF TABLET
50.0000 ug | ORAL_TABLET | ORAL | Status: DC | PRN
  Administered 2017-11-23 (×2): 50 ug via BUCCAL
  Filled 2017-11-23 (×3): qty 1

## 2017-11-23 MED ORDER — OXYCODONE-ACETAMINOPHEN 5-325 MG PO TABS
2.0000 | ORAL_TABLET | ORAL | Status: DC | PRN
Start: 2017-11-23 — End: 2017-11-24

## 2017-11-23 MED ORDER — LACTATED RINGERS IV SOLN
500.0000 mL | INTRAVENOUS | Status: DC | PRN
  Administered 2017-11-24: 500 mL via INTRAVENOUS

## 2017-11-23 MED ORDER — FENTANYL CITRATE (PF) 100 MCG/2ML IJ SOLN
50.0000 ug | INTRAMUSCULAR | Status: DC | PRN
  Administered 2017-11-23 (×2): 50 ug via INTRAVENOUS
  Filled 2017-11-23 (×2): qty 2

## 2017-11-23 MED ORDER — SOD CITRATE-CITRIC ACID 500-334 MG/5ML PO SOLN: 30.0000 mL | ORAL | Status: DC | PRN

## 2017-11-23 MED ORDER — OXYTOCIN 40 UNITS IN LACTATED RINGERS INFUSION - SIMPLE MED
2.5000 [IU]/h | INTRAVENOUS | Status: DC
  Administered 2017-11-24: 2.5 [IU]/h via INTRAVENOUS

## 2017-11-23 NOTE — Progress Notes (Signed)
Labor Progress Note  Diane CapersBrittney M [name] is a 27 y.o. G3P2002 at 234w0d  admitted for induction of labor due to low lying placenta and MFM recommendation.  S: Patient doing well and comfortable. No concerns voiced. Starting to feel more uncomfortable.   O:  BP 118/71   Pulse 98   Temp 98.3 F (36.8 C) (Oral)   Resp 18   Ht 5\' 5"  (1.651 m)   Wt 176 lb 1.9 oz (79.9 kg)   LMP 02/23/2017   SpO2 98%   BMI 29.31 kg/m   No intake/output data recorded.  FHT:  FHR: 135 bpm, variability: moderate,  accelerations:  Present,  decelerations:  Present variable UC:   regular, every 3-4 minutes SVE:   Dilation: 2.5 Effacement (%): 50 Station: -3 Exam by:: Dr. Doroteo GlassmanPhelps   Pitocin @ 4 mu/min  Labs: Lab Results  Component Value Date   WBC 12.8 (H) 11/23/2017   HGB 11.0 (L) 11/23/2017   HCT 33.1 (L) 11/23/2017   MCV 88.3 11/23/2017   PLT 288 11/23/2017    Assessment / Plan: 27 y.o. G3P2002 554w0d. Not in labor Induction of labor due to low-lying placenta. Progressing well on pitocin  Labor: Progressing normally on Pitocin, will avoid frequent cervical checks.  Fetal Wellbeing:  Category II Pain Control:  IV pain meds Anticipated MOD:  NSVD  Expectant management   Caryl AdaJazma Phelps, DO OB Fellow Center for [facility], [facility]

## 2017-11-23 NOTE — H&P (Addendum)
OBSTETRIC ADMISSION HISTORY AND PHYSICAL  Diane [name] is a 27 y.o. female G3P2002 with IUP at [redacted]w[redacted]d by LMP presenting for IOL for low lying placenta (1cm from os). She reports +FMs, No LOF, no VB, no blurry vision, headaches or peripheral edema, and RUQ pain.  She plans on breast feeding. She is still undecided about birth control She received her prenatal care at [facility]    Clinic  [facility] Prenatal Labs  Dating  LMP c/w 12wk sono Blood type:    Genetic Screen    AFP:  neg   NIPS:Mat21:neg Antibody:  Anatomic [place] Normal anatomy; female fetus Placenta previa f/u scan ordered Rubella: @RUBELLARESULTSCONSOLE @  GTT Early:               Third trimester:  RPR: Non Reactive (01/23 1150)   Flu vaccine  decline 10/2 HBsAg: Negative (10/02 1056)   TDaP vaccine Declined 1/23                                            Rhogam:n/a B+ HIV: Non Reactive (01/23 1150)   Baby Food    breast                                           [id number] (03/22 0000)  Contraception   NA Pap:05/14/17: negative  Circumcision  n/a   Pediatrician [facility] CF:Neg  Support Person  FOB- [place]   Prenatal Classes   Hgb electrophoresis:normal   Dating: By LMP --->  Estimated Date of Delivery: 11/30/17  Sono:    @[redacted]w[redacted]d , CWD, normal anatomy, cephalid presentation   Prenatal History/Complications:  Past Medical History: Past Medical History:  Diagnosis Date  . Anemia    pt's states not anemic  . Headache    Migraines - not on meds    Past Surgical History: Past Surgical History:  Procedure Laterality Date  . LAPAROSCOPIC LYSIS OF ADHESIONS N/A 07/30/2016   Procedure: LAPAROSCOPIC LYSIS OF ADHESIONS;  Surgeon: Crozet Bing, MD;  Location: [facility];  Service: Gynecology;  Laterality: N/A;  . LAPAROSCOPY N/A 07/30/2016   Procedure: LAPAROSCOPY DIAGNOSTIC;  Surgeon:  Bing, MD;  Location: [facility];  Service: Gynecology;  Laterality: N/A;  . WISDOM TOOTH EXTRACTION      Obstetrical  History: OB History    Gravida  3   Para  2   Term  2   Preterm      AB      Living  2     SAB      TAB      Ectopic      Multiple      Live Births  2        Obstetric Comments  SVD x 2        Social History: Social History   Socioeconomic History  . Marital status: Single    Spouse name: Not on file  . Number of children: Not on file  . Years of education: Not on file  . Highest education level: Not on file  Occupational History  . Not on file  Social Needs  . Financial resource strain: Not on file  . Food insecurity:    Worry: Not on file    Inability: Not on file  .  Transportation needs:    Medical: Not on file    Non-medical: Not on file  Tobacco Use  . Smoking status: Never Smoker  . Smokeless tobacco: Never Used  Substance and Sexual Activity  . Alcohol use: No  . Drug use: No  . Sexual activity: Not Currently    Birth control/protection: None  Lifestyle  . Physical activity:    Days per week: Not on file    Minutes per session: Not on file  . Stress: Not on file  Relationships  . Social connections:    Talks on phone: Not on file    Gets together: Not on file    Attends religious service: Not on file    Active member of club or organization: Not on file    Attends meetings of clubs or organizations: Not on file    Relationship status: Not on file  Other Topics Concern  . Not on file  Social History Narrative  . Not on file    Family History: Family History  Problem Relation Age of Onset  . Diabetes Mother   . Diabetes Maternal Grandmother     Allergies: Allergies  Allergen Reactions  . Flagyl [Metronidazole] Itching    Medications Prior to Admission  Medication Sig Dispense Refill Last Dose  . Prenatal Vit-Fe Fumarate-FA (PRENATAL MULTIVITAMIN) TABS tablet Take 1 tablet by mouth daily at 12 noon.   Past Week at Unknown time  . Elastic Bandages & Supports (COMFORT FIT MATERNITY SUPP SM) MISC Wear as directed.  (Patient not taking: Reported on 11/14/2017) 1 each 0 Not Taking  . terconazole (TERAZOL 3) 0.8 % vaginal cream Place 1 applicator vaginally at bedtime. (Patient not taking: Reported on 11/14/2017) 20 g 0 Not Taking at Unknown time     Review of Systems   All systems reviewed and negative except as stated in HPI  Blood pressure 118/70, pulse 94, temperature 98 F (36.7 C), temperature source Oral, resp. rate 16, height 5\' 5"  (1.651 m), weight 79.9 kg (176 lb 1.9 oz), last menstrual period 02/23/2017. General appearance: alert and cooperative Lungs: clear to auscultation bilaterally Heart: regular rate and rhythm Abdomen: soft, non-tender; bowel sounds normal Extremities: Homans sign is negative, no sign of DVT Presentation: cephalic Fetal monitoringBaseline: 140 bpm, Variability: Good {> 6 bpm), Accelerations: Reactive and Decelerations: Absent Uterine activityFrequency: Every 10 minutes Dilation: Fingertip Effacement (%): 70 Station: -3 Exam by:: J.Follmer,RNC   Prenatal labs: ABO, Rh: B/Positive/-- (10/02 1056) Antibody: Negative (10/02 1056) Rubella: 5.27 (10/02 1056) RPR: Non Reactive (01/23 1150)  HBsAg: Negative (10/02 1056)  HIV: Non Reactive (01/23 1150)  GBS: Negative (03/22 0000)  Glucola: 1 hour/fast/2 hour-> 141/82/129 Genetic screening  normal Anatomy US normal, placenta 1 cm from os  Prenatal Transfer Tool  Maternal Diabetes: No Genetic Screening: Normal Maternal Ultrasounds/Referrals: Normal Fetal Ultrasounds or other Referrals:  None Maternal Substance Abuse:  No Significant Maternal Medications:  None Significant Maternal Lab Results: Lab values include: Other: candida vaginitis + on 3/21  No results found for this or any previous visit (from the past 24 hour(s)).  Patient Active Problem List   Diagnosis Date Noted  . Encounter for induction of labor 11/23/2017  . Low-lying placenta 07/09/2017  . Supervision of normal pregnancy, antepartum 05/13/2017   . Dyspareunia in female 09/13/2016  . Dysmenorrhea 01/30/2016    Assessment/Plan:  Diane [name] is a 27 y.o. G3P2002 at [redacted]w[redacted]d here for IOL for low-lying placenta. Will avoid premature AROM in this patient.   #  Labor: augmentation with cytotec, received 50mcg x1 buccal #Pain: Analgesia per request #FWB: Cat 1 #ID:  n/a #MOF: breast #MOC: undecided #Circ:  n/a  Myrene BuddyJacob Fletcher, MD  11/23/2017, 9:05 AM   I confirm that I have verified the information documented in the resident's note and that I have also personally reperformed the physical exam and all medical decision making activities.   Thressa ShellerHeather Hogan 11:21 AM 11/23/17

## 2017-11-23 NOTE — Progress Notes (Signed)
Diane [name] is a 27 y.o. G3P2002 at 1351w0d  admitted for induction of labor due to low lying placenta and per MFM plan for attempt at vaginal deilvery.  Subjective:  RN noticed some vaginal bleeding. Patient reports contractions that she rates 6/10.  Objective: BP 127/83   Pulse (!) 105   Temp 98.2 F (36.8 C) (Oral)   Resp 16   Ht 5\' 5"  (1.651 m)   Wt 176 lb 1.9 oz (79.9 kg)   LMP 02/23/2017   BMI 29.31 kg/m  No intake/output data recorded. No intake/output data recorded.   About a silver dollar sized spot of frank blood noted on the pad. While patient was in the bathroom she was noted to have continuous small amount of bleeding.   FHT:  FHR: 135 bpm, variability: moderate,  accelerations:  Present,  decelerations:  Absent UC:   irregular, every 3-7 minutes SVE:   Dilation: 1 Effacement (%): 70 Station: -3 Exam by:: J.Follmer,RNC  Labs: Lab Results  Component Value Date   WBC 12.8 (H) 11/23/2017   HGB 11.0 (L) 11/23/2017   HCT 33.1 (L) 11/23/2017   MCV 88.3 11/23/2017   PLT 288 11/23/2017    Assessment / Plan: IOL 2/2 low lying placenta and need for planned delivery.   Labor: cervical ripening phase of labor  Preeclampsia:  NA Fetal Wellbeing:  Category I Pain Control:  Labor support without medications I/D:  n/a Anticipated MOD:  guarded at this time for NSVD   MFM recommendations:  "I would recommend induction between 38 and 39  weeks with blood available, and the understanding that any  more than minimal vaginal bleeding during the process  should result in abandoning the attempt and proceeding to  cesarean delivery."  Dr. Debroah LoopArnold notified. He is in the OR at this time. FHR tracing Cat I, and will have him come assess the patient once he is done.   Thressa ShellerHeather Ashlay Altieri 11/23/2017, 5:39 PM

## 2017-11-23 NOTE — Anesthesia Pain Management Evaluation Note (Signed)
  CRNA Pain Management Visit Note  Patient: Diane [name], 27 y.o., female  "Hello [facility] am a [facility]. We have an anesthesia team available at all times to provide care throughout the hospital, including epidural management and anesthesia for C-section. I don't know your plan for the delivery whether it a natural birth, water birth, IV sedation, nitrous supplementation, doula or epidural, but we want to meet your pain goals."   1.Was your pain managed to your expectations on prior hospitalizations?   Yes   2.What is your expectation for pain management during this hospitalization?     Epidural  3.How can we help you reach that goal? epidural  Record the patient's initial score and the patient's pain goal.   Pain: 4/10  Pain Goal: 4/10 [facility] wants you to be able to say your pain was always managed very well.  Diane [name], Diane [name] 11/23/2017

## 2017-11-24 ENCOUNTER — Inpatient Hospital Stay (HOSPITAL_COMMUNITY): Payer: Medicaid Other | Admitting: Anesthesiology

## 2017-11-24 ENCOUNTER — Encounter (HOSPITAL_COMMUNITY): Payer: Self-pay

## 2017-11-24 DIAGNOSIS — Z3A39 39 weeks gestation of pregnancy: Secondary | ICD-10-CM

## 2017-11-24 LAB — CBC
HCT: 32.8 % — ABNORMAL LOW (ref 36.0–46.0)
Hemoglobin: 10.7 g/dL — ABNORMAL LOW (ref 12.0–15.0)
MCH: 29 pg (ref 26.0–34.0)
MCHC: 32.6 g/dL (ref 30.0–36.0)
MCV: 88.9 fL (ref 78.0–100.0)
Platelets: 256 10*3/uL (ref 150–400)
RBC: 3.69 MIL/uL — AB (ref 3.87–5.11)
RDW: 14.2 % (ref 11.5–15.5)
WBC: 13.3 10*3/uL — ABNORMAL HIGH (ref 4.0–10.5)

## 2017-11-24 LAB — RPR: RPR Ser Ql: NONREACTIVE

## 2017-11-24 MED ORDER — FENTANYL 2.5 MCG/ML BUPIVACAINE 1/10 % EPIDURAL INFUSION (WH - ANES)
INTRAMUSCULAR | Status: AC
  Filled 2017-11-24: qty 100

## 2017-11-24 MED ORDER — DIPHENHYDRAMINE HCL 25 MG PO CAPS: 25.0000 mg | ORAL_CAPSULE | Freq: Four times a day (QID) | ORAL | Status: DC | PRN

## 2017-11-24 MED ORDER — LIDOCAINE HCL (PF) 1 % IJ SOLN
INTRAMUSCULAR | Status: DC | PRN
  Administered 2017-11-24: 4 mL via EPIDURAL

## 2017-11-24 MED ORDER — EPHEDRINE 5 MG/ML INJ: 10.0000 mg | INTRAVENOUS | Status: DC | PRN

## 2017-11-24 MED ORDER — TETANUS-DIPHTH-ACELL PERTUSSIS 5-2.5-18.5 LF-MCG/0.5 IM SUSP: 0.5000 mL | Freq: Once | INTRAMUSCULAR | Status: DC

## 2017-11-24 MED ORDER — ONDANSETRON HCL 4 MG PO TABS: 4.0000 mg | ORAL_TABLET | ORAL | Status: DC | PRN

## 2017-11-24 MED ORDER — PHENYLEPHRINE 40 MCG/ML (10ML) SYRINGE FOR IV PUSH (FOR BLOOD PRESSURE SUPPORT)
80.0000 ug | PREFILLED_SYRINGE | INTRAVENOUS | Status: DC | PRN
  Filled 2017-11-24: qty 5

## 2017-11-24 MED ORDER — FENTANYL 2.5 MCG/ML BUPIVACAINE 1/10 % EPIDURAL INFUSION (WH - ANES)
14.0000 mL/h | INTRAMUSCULAR | Status: DC | PRN
  Administered 2017-11-24: 14 mL/h via EPIDURAL

## 2017-11-24 MED ORDER — DIPHENHYDRAMINE HCL 50 MG/ML IJ SOLN: 12.5000 mg | INTRAMUSCULAR | Status: DC | PRN

## 2017-11-24 MED ORDER — SENNOSIDES-DOCUSATE SODIUM 8.6-50 MG PO TABS
2.0000 | ORAL_TABLET | ORAL | Status: DC
  Administered 2017-11-24: 2 via ORAL
  Filled 2017-11-24: qty 2

## 2017-11-24 MED ORDER — EPHEDRINE 5 MG/ML INJ
10.0000 mg | INTRAVENOUS | Status: DC | PRN
  Filled 2017-11-24: qty 2

## 2017-11-24 MED ORDER — LACTATED RINGERS IV SOLN: 500.0000 mL | Freq: Once | INTRAVENOUS | Status: DC

## 2017-11-24 MED ORDER — DIBUCAINE 1 % RE OINT: 1.0000 "application " | TOPICAL_OINTMENT | RECTAL | Status: DC | PRN

## 2017-11-24 MED ORDER — SIMETHICONE 80 MG PO CHEW: 80.0000 mg | CHEWABLE_TABLET | ORAL | Status: DC | PRN

## 2017-11-24 MED ORDER — WITCH HAZEL-GLYCERIN EX PADS: 1.0000 "application " | MEDICATED_PAD | CUTANEOUS | Status: DC | PRN

## 2017-11-24 MED ORDER — COCONUT OIL OIL: 1.0000 "application " | TOPICAL_OIL | Status: DC | PRN

## 2017-11-24 MED ORDER — IBUPROFEN 600 MG PO TABS
600.0000 mg | ORAL_TABLET | Freq: Four times a day (QID) | ORAL | Status: DC
  Administered 2017-11-24 – 2017-11-25 (×5): 600 mg via ORAL
  Filled 2017-11-24 (×5): qty 1

## 2017-11-24 MED ORDER — LACTATED RINGERS IV SOLN
500.0000 mL | Freq: Once | INTRAVENOUS | Status: AC
  Administered 2017-11-24: 500 mL via INTRAVENOUS

## 2017-11-24 MED ORDER — PRENATAL MULTIVITAMIN CH
1.0000 | ORAL_TABLET | Freq: Every day | ORAL | Status: DC
  Administered 2017-11-24 – 2017-11-25 (×2): 1 via ORAL
  Filled 2017-11-24 (×2): qty 1

## 2017-11-24 MED ORDER — PHENYLEPHRINE 40 MCG/ML (10ML) SYRINGE FOR IV PUSH (FOR BLOOD PRESSURE SUPPORT)
PREFILLED_SYRINGE | INTRAVENOUS | Status: AC
  Filled 2017-11-24: qty 10

## 2017-11-24 MED ORDER — ONDANSETRON HCL 4 MG/2ML IJ SOLN: 4.0000 mg | INTRAMUSCULAR | Status: DC | PRN

## 2017-11-24 MED ORDER — ACETAMINOPHEN 325 MG PO TABS
650.0000 mg | ORAL_TABLET | ORAL | Status: DC | PRN
Start: 2017-11-24 — End: 2017-11-25

## 2017-11-24 MED ORDER — PHENYLEPHRINE 40 MCG/ML (10ML) SYRINGE FOR IV PUSH (FOR BLOOD PRESSURE SUPPORT): 80.0000 ug | PREFILLED_SYRINGE | INTRAVENOUS | Status: DC | PRN

## 2017-11-24 MED ORDER — ZOLPIDEM TARTRATE 5 MG PO TABS: 5.0000 mg | ORAL_TABLET | Freq: Every evening | ORAL | Status: DC | PRN

## 2017-11-24 MED ORDER — BENZOCAINE-MENTHOL 20-0.5 % EX AERO
1.0000 "application " | INHALATION_SPRAY | CUTANEOUS | Status: DC | PRN
  Administered 2017-11-24: 1 via TOPICAL
  Filled 2017-11-24: qty 56

## 2017-11-24 NOTE — Anesthesia Preprocedure Evaluation (Signed)
Anesthesia Evaluation  Patient identified by MRN, date of birth, ID band Patient awake    Reviewed: Allergy & Precautions, Patient's Chart, lab work & pertinent test results  Airway Mallampati: I       Dental no notable dental hx.    Pulmonary    Pulmonary exam normal        Cardiovascular Normal cardiovascular exam     Neuro/Psych  Headaches, negative psych ROS   GI/Hepatic Neg liver ROS,   Endo/Other  negative endocrine ROS  Renal/GU      Musculoskeletal   Abdominal   Peds  Hematology negative hematology ROS (+)   Anesthesia Other Findings   Reproductive/Obstetrics (+) Pregnancy                             Anesthesia Physical Anesthesia Plan  ASA: II  Anesthesia Plan: Epidural   Post-op Pain Management:    Induction:   PONV Risk Score and Plan:   Airway Management Planned:   Additional Equipment:   Intra-op Plan:   Post-operative Plan:   Informed Consent: I have reviewed the patients History and Physical, chart, labs and discussed the procedure including the risks, benefits and alternatives for the proposed anesthesia with the patient or authorized representative who has indicated his/her understanding and acceptance.     Plan Discussed with:   Anesthesia Plan Comments: (Lab Results      Component                Value               Date                      WBC                      12.8 (H)            11/23/2017                HGB                      11.0 (L)            11/23/2017                HCT                      33.1 (L)            11/23/2017                MCV                      88.3                11/23/2017                PLT                      288                 11/23/2017           )        Anesthesia Quick Evaluation

## 2017-11-24 NOTE — Anesthesia Postprocedure Evaluation (Signed)
Anesthesia Post Note  Patient: Diane [name]  Procedure(s) Performed: AN AD HOC LABOR EPIDURAL     Patient location during evaluation: Mother Baby Anesthesia Type: Epidural Level of consciousness: awake and alert Pain management: pain level controlled Vital Signs Assessment: post-procedure vital signs reviewed and stable Respiratory status: spontaneous breathing, nonlabored ventilation and respiratory function stable Cardiovascular status: stable Postop Assessment: no headache, no backache and epidural receding Anesthetic complications: no    Last Vitals:  Vitals:   11/24/17 0630 11/24/17 0712  BP: 114/61 (!) 104/58  Pulse: 84 (!) 253  Resp:  18  Temp:  36.7 C  SpO2:      Last Pain:  Vitals:   11/24/17 0733  TempSrc:   PainSc: 0-No pain   Pain Goal:                 Zyheir Daft

## 2017-11-24 NOTE — Anesthesia Procedure Notes (Signed)
Epidural Patient location during procedure: OB Start time: 11/24/2017 1:38 AM End time: 11/24/2017 1:44 AM  Staffing Anesthesiologist: Shelton SilvasHollis, Leondra Cullin D, MD Performed: anesthesiologist   Preanesthetic Checklist Completed: patient identified, site marked, surgical consent, pre-op evaluation, timeout performed, IV checked, risks and benefits discussed and monitors and equipment checked  Epidural Patient position: sitting Prep: ChloraPrep Patient monitoring: heart rate, continuous pulse ox and blood pressure Approach: midline Location: L3-L4 Injection technique: LOR saline  Needle:  Needle type: Tuohy  Needle gauge: 17 G Needle length: 9 cm Catheter type: closed end flexible Catheter size: 20 Guage Test dose: negative and 1.5% lidocaine  Assessment Events: blood not aspirated, injection not painful, no injection resistance and no paresthesia  Additional Notes LOR @ 4.5  Patient identified. Risks/Benefits/Options discussed with patient including but not limited to bleeding, infection, nerve damage, paralysis, failed block, incomplete pain control, headache, blood pressure changes, nausea, vomiting, reactions to medications, itching and postpartum back pain. Confirmed with bedside nurse the patient's most recent platelet count. Confirmed with patient that they are not currently taking any anticoagulation, have any bleeding history or any family history of bleeding disorders. Patient expressed understanding and wished to proceed. All questions were answered. Sterile technique was used throughout the entire procedure. Please see nursing notes for vital signs. Test dose was given through epidural catheter and negative prior to continuing to dose epidural or start infusion. Warning signs of high block given to the patient including shortness of breath, tingling/numbness in hands, complete motor block, or any concerning symptoms with instructions to call for help. Patient was given instructions  on fall risk and not to get out of bed. All questions and concerns addressed with instructions to call with any issues or inadequate analgesia.    Reason for block:procedure for pain

## 2017-11-24 NOTE — Addendum Note (Signed)
Addendum  created 11/24/17 1313 by Shanon PayorGregory, Sujata Maines M, CRNA   Sign clinical note

## 2017-11-24 NOTE — Progress Notes (Signed)
LABOR PROGRESS NOTE  Subjective:  Patient seen and examined for progress of labor. Patient comfortable with epidural.   Objective:  Vitals:   11/24/17 0125 11/24/17 0130 11/24/17 0141 11/24/17 0146  BP:  118/71 116/73 110/66  Pulse:  87 92 92  Resp:      Temp:      TempSrc:      SpO2: 97% 94%    Weight:      Height:       Dilation: 2.5 Effacement (%): 60 Cervical Position: Posterior Station: -3 Presentation: Vertex Exam by:: Diane SalaamLynnsey Johnson, RN FHT: 125 bpm, minimal variability, accelerations present, absent decelerations TOCO: regular, every 3-5 minutes  Assessment/Plan: Diane [name] is a 27 y.o. G3P2002 at 639w0d here for IOL for [name]-lying placenta. Will avoid premature AROM in this patient.  Labor: stage 1 Fetal wellbeing: category 1 Pain control: epidural I/D: negative Anticipated MOD: continue expectant management, anticipate SVD  Diane Parcelavid McMullen, DO, PGY-2 11/24/2017, 1:48 AM

## 2017-11-24 NOTE — Anesthesia Postprocedure Evaluation (Signed)
Anesthesia Post Note  Patient: Diane CapersBrittney M [name]  Procedure(s) Performed: AN AD HOC LABOR EPIDURAL     Patient location during evaluation: Mother Baby Anesthesia Type: Epidural Level of consciousness: awake and alert and oriented Pain management: satisfactory to patient Vital Signs Assessment: post-procedure vital signs reviewed and stable Respiratory status: spontaneous breathing and nonlabored ventilation Cardiovascular status: stable Postop Assessment: no headache, no backache, no signs of nausea or vomiting, adequate PO intake and patient able to bend at knees (patient up walking) Anesthetic complications: no    Last Vitals:  Vitals:   11/24/17 0854 11/24/17 1006  BP: 110/66 112/65  Pulse: 88 83  Resp: 20 18  Temp: 36.6 C 36.8 C  SpO2:      Last Pain:  Vitals:   11/24/17 0854  TempSrc:   PainSc: 0-No pain   Pain Goal:                 Madison HickmanGREGORY,Kai Railsback

## 2017-11-25 MED ORDER — IBUPROFEN 600 MG PO TABS: 600.0000 mg | ORAL_TABLET | Freq: Four times a day (QID) | ORAL | 0 refills | Status: AC

## 2017-11-25 NOTE — Lactation Note (Signed)
This note was copied from a baby's chart. Lactation Consultation Note  Patient Name: Girl Evette CristalBrittney Berkovich NFAOZ'HToday's Date: 11/25/2017 Reason for consult: Initial assessment;1st time breastfeeding;Term Baby is 30 hours old and mom feels feedings are going well.  Basic teaching done including engorgement treatment.  Assisted with positioning baby in football hold on right.  Baby latched easily and fed well with swallows noted.  Also assisted with cross cradle on left side.  Noted that anterior lingual frenulum is short.  Baby has good tongue extension but some restriction elevating tongue.  I pointed this out to mom and reviewed what to watch for in regards to difficulty with breastfeeding.  Mom is aware of outpatient services.  Breastfeeding consultation services and support information given to her.  Questions answered.  Maternal Data Has patient been taught Hand Expression?: Yes Does the patient have breastfeeding experience prior to this delivery?: No  Feeding Feeding Type: Breast Fed Length of feed: 20 min  LATCH Score Latch: Grasps breast easily, tongue down, lips flanged, rhythmical sucking.  Audible Swallowing: A few with stimulation  Type of Nipple: Everted at rest and after stimulation  Comfort (Breast/Nipple): Soft / non-tender  Hold (Positioning): Assistance needed to correctly position infant at breast and maintain latch.  LATCH Score: 8  Interventions Interventions: Breast feeding basics reviewed;Assisted with latch;Breast compression;Skin to skin;Adjust position;Breast massage;Support pillows;Hand express;Position options  Lactation Tools Discussed/Used     Consult Status Consult Status: Complete    Huston FoleyMOULDEN, Takera Rayl S 11/25/2017, 11:48 AM

## 2017-11-25 NOTE — Discharge Summary (Signed)
OB Discharge Summary     Patient Name: Diane [name] DOB: 08-22-1990 MRN: [id number]  Date of admission: 11/23/2017 Delivering MD: Pincus Large   Date of discharge: 11/25/2017  Admitting diagnosis: 39 wk induction Intrauterine pregnancy: [redacted]w[redacted]d     Secondary diagnosis:  Active Problems:   Encounter for induction of labor  Additional problems: low lying placenta     Discharge diagnosis: Term Pregnancy Delivered                                                                                                Post partum procedures:none  Augmentation: Pitocin and Cytotec  Complications: None  Hospital course:  Induction of Labor With Vaginal Delivery   27 y.o. yo G3P3003 at [redacted]w[redacted]d was admitted to the hospital 11/23/2017 for induction of labor.  Indication for induction: Low lying placenta.  Patient had an uncomplicated labor course as follows: Membrane Rupture Time/Date: 4:42 AM ,11/24/2017   Intrapartum Procedures: Episiotomy: None [1]                                         Lacerations:  None [1]  Patient had delivery of a Viable infant.  Information for the patient's newborn:  Latonda, Larrivee [[id number]]  Delivery Method: Vaginal, Spontaneous(Filed from Delivery Summary)   11/24/2017  Details of delivery can be found in separate delivery note.  Patient had a routine postpartum course. Patient is discharged home 11/25/17.  Physical exam  Vitals:   11/24/17 1430 11/24/17 1720 11/24/17 2111 11/25/17 0547  BP: 106/68 101/83 107/69 108/66  Pulse: 81 80 87 75  Resp: 20 20 18 18   Temp: 98.1 F (36.7 C) 98.1 F (36.7 C) 98.9 F (37.2 C) 98.2 F (36.8 C)  TempSrc:  Oral Oral Oral  SpO2:   100% 98%  Weight:      Height:       General: alert, cooperative and no distress Lochia: appropriate Uterine Fundus: firm Incision: N/A DVT Evaluation: No evidence of DVT seen on physical exam. Negative Homan's sign. No cords or calf tenderness. Labs: Lab Results   Component Value Date   WBC 13.3 (H) 11/24/2017   HGB 10.7 (L) 11/24/2017   HCT 32.8 (L) 11/24/2017   MCV 88.9 11/24/2017   PLT 256 11/24/2017   CMP Latest Ref Rng & Units 09/18/2017  Glucose 65 - 99 mg/dL 782(N)  BUN 6 - 20 mg/dL 4(L)  Creatinine 5.62 - 1.00 mg/dL 1.30(Q)  Sodium 657 - 846 mmol/L 138  Potassium 3.5 - 5.2 mmol/L 3.5  Chloride 96 - 106 mmol/L 101  CO2 20 - 29 mmol/L 20  Calcium 8.7 - 10.2 mg/dL 9.1  Total Protein 6.0 - 8.5 g/dL 6.5  Total Bilirubin 0.0 - 1.2 mg/dL 0.2  Alkaline Phos 39 - 117 IU/L 72  AST 0 - 40 IU/L 16  ALT 0 - 32 IU/L 17    Discharge instruction: per After Visit Summary and "Baby and Me Booklet".  After visit meds:  Allergies as of 11/25/2017      Reactions   Flagyl [metronidazole] Itching      Medication List    STOP taking these medications   COMFORT FIT MATERNITY SUPP SM Misc   terconazole 0.8 % vaginal cream Commonly known as:  TERAZOL 3     TAKE these medications   ibuprofen 600 MG tablet Commonly known as:  ADVIL,MOTRIN Take 1 tablet (600 mg total) by mouth every 6 (six) hours.   prenatal multivitamin Tabs tablet Take 1 tablet by mouth daily at 12 noon.       Diet: routine diet  Activity: Advance as tolerated. Pelvic rest for 6 weeks.   Outpatient follow up:4 weeks Follow up Appt: Future Appointments  Date Time Provider Department Center  12/16/2017 10:30 AM Constant, Gigi GinPeggy, MD [facility] None   Follow up Visit:No follow-ups on file.  Postpartum contraception: None  Newborn Data: Live born female  Birth Weight: 6 lb 6.8 oz (2914 g) APGAR: 8, 9  Newborn Delivery   Birth date/time:  11/24/2017 04:51:00 Delivery type:  Vaginal, Spontaneous     Baby Feeding: Breast Disposition:home with mother   11/25/2017 Levie HeritageJacob J Leanthony Rhett, DO

## 2017-11-25 NOTE — Discharge Instructions (Signed)
Vaginal Delivery, Care After °Refer to this sheet in the next few weeks. These instructions provide you with information about caring for yourself after vaginal delivery. Your health care provider may also give you more specific instructions. Your treatment has been planned according to current medical practices, but problems sometimes occur. Call your health care provider if you have any problems or questions. °What can I expect after the procedure? °After vaginal delivery, it is common to have: °· Some bleeding from your vagina. °· Soreness in your abdomen, your vagina, and the area of skin between your vaginal opening and your anus (perineum). °· Pelvic cramps. °· Fatigue. ° °Follow these instructions at home: °Medicines °· Take over-the-counter and prescription medicines only as told by your health care provider. °· If you were prescribed an antibiotic medicine, take it as told by your health care provider. Do not stop taking the antibiotic until it is finished. °Driving ° °· Do not drive or operate heavy machinery while taking prescription pain medicine. °· Do not drive for 24 hours if you received a sedative. °Lifestyle °· Do not drink alcohol. This is especially important if you are breastfeeding or taking medicine to relieve pain. °· Do not use tobacco products, including cigarettes, chewing tobacco, or e-cigarettes. If you need help quitting, ask your health care provider. °Eating and drinking °· Drink at least 8 eight-ounce glasses of water every day unless you are told not to by your health care provider. If you choose to breastfeed your baby, you may need to drink more water than this. °· Eat high-fiber foods every day. These foods may help prevent or relieve constipation. High-fiber foods include: °? Whole grain cereals and breads. °? Brown rice. °? Beans. °? Fresh fruits and vegetables. °Activity °· Return to your normal activities as told by your health care provider. Ask your health care provider  what activities are safe for you. °· Rest as much as possible. Try to rest or take a nap when your baby is sleeping. °· Do not lift anything that is heavier than your baby or 10 lb (4.5 kg) until your health care provider says that it is safe. °· Talk with your health care provider about when you can engage in sexual activity. This may depend on your: °? Risk of infection. °? Rate of healing. °? Comfort and desire to engage in sexual activity. °Vaginal Care °· If you have an episiotomy or a vaginal tear, check the area every day for signs of infection. Check for: °? More redness, swelling, or pain. °? More fluid or blood. °? Warmth. °? Pus or a bad smell. °· Do not use tampons or douches until your health care provider says this is safe. °· Watch for any blood clots that may pass from your vagina. These may look like clumps of dark red, brown, or black discharge. °General instructions °· Keep your perineum clean and dry as told by your health care provider. °· Wear loose, comfortable clothing. °· Wipe from front to back when you use the toilet. °· Ask your health care provider if you can shower or take a bath. If you had an episiotomy or a perineal tear during labor and delivery, your health care provider may tell you not to take baths for a certain length of time. °· Wear a bra that supports your breasts and fits you well. °· If possible, have someone help you with household activities and help care for your baby for at least a few days after   you leave the hospital. °· Keep all follow-up visits for you and your baby as told by your health care provider. This is important. °Contact a health care provider if: °· You have: °? Vaginal discharge that has a bad smell. °? Difficulty urinating. °? Pain when urinating. °? A sudden increase or decrease in the frequency of your bowel movements. °? More redness, swelling, or pain around your episiotomy or vaginal tear. °? More fluid or blood coming from your episiotomy or  vaginal tear. °? Pus or a bad smell coming from your episiotomy or vaginal tear. °? A fever. °? A rash. °? Little or no interest in activities you used to enjoy. °? Questions about caring for yourself or your baby. °· Your episiotomy or vaginal tear feels warm to the touch. °· Your episiotomy or vaginal tear is separating or does not appear to be healing. °· Your breasts are painful, hard, or turn red. °· You feel unusually sad or worried. °· You feel nauseous or you vomit. °· You pass large blood clots from your vagina. If you pass a blood clot from your vagina, save it to show to your health care provider. Do not flush blood clots down the toilet without having your health care provider look at them. °· You urinate more than usual. °· You are dizzy or light-headed. °· You have not breastfed at all and you have not had a menstrual period for 12 weeks after delivery. °· You have stopped breastfeeding and you have not had a menstrual period for 12 weeks after you stopped breastfeeding. °Get help right away if: °· You have: °? Pain that does not go away or does not get better with medicine. °? Chest pain. °? Difficulty breathing. °? Blurred vision or spots in your vision. °? Thoughts about hurting yourself or your baby. °· You develop pain in your abdomen or in one of your legs. °· You develop a severe headache. °· You faint. °· You bleed from your vagina so much that you fill two sanitary pads in one hour. °This information is not intended to replace advice given to you by your health care provider. Make sure you discuss any questions you have with your health care provider. °Document Released: 07/27/2000 Document Revised: 01/11/2016 Document Reviewed: 08/14/2015 °Elsevier Interactive Patient Education © 2018 [employer] ° °

## 2017-12-16 ENCOUNTER — Encounter: Payer: Self-pay | Admitting: Obstetrics and Gynecology

## 2017-12-16 ENCOUNTER — Ambulatory Visit (INDEPENDENT_AMBULATORY_CARE_PROVIDER_SITE_OTHER): Payer: Medicaid Other | Admitting: Obstetrics and Gynecology

## 2017-12-16 DIAGNOSIS — Z1389 Encounter for screening for other disorder: Secondary | ICD-10-CM | POA: Diagnosis not present

## 2017-12-16 NOTE — Progress Notes (Signed)
Post Partum Exam  Diane [name] is a 27 y.o. G32P3003 female who presents for a postpartum visit. She is 3 weeks postpartum following a spontaneous vaginal delivery. I have fully reviewed the prenatal and intrapartum course. The delivery was at [redacted]w[redacted]d gestational weeks.  Anesthesia: epidural. Postpartum course has been unremarkable. Baby's course has been unremarkable. Baby is feeding by pumping breast milk. . Bleeding no bleeding. Bowel function is normal. Bladder function is normal. Patient is not sexually active. Contraception method is abstinence. Postpartum depression screening:neg. EPDS: 1    Last pap smear done 05/14/17 and was Normal  Review of Systems Pertinent items are noted in HPI.    Objective:  Blood pressure 108/68, pulse 69, weight 158 lb 8 oz (71.9 kg), not currently breastfeeding.  General:  alert, cooperative and no distress   Breasts:  inspection negative, no nipple discharge or bleeding, no masses or nodularity palpable  Lungs: clear to auscultation bilaterally  Heart:  regular rate and rhythm, S1, S2 normal, no murmur, click, rub or gallop  Abdomen: soft, non-tender; bowel sounds normal; no masses,  no organomegaly   Vulva:  normal  Vagina: normal vagina, no discharge, exudate, lesion, or erythema  Cervix:  multiparous appearance  Corpus: normal size, contour, position, consistency, mobility, non-tender  Adnexa:  no mass, fullness, tenderness  Rectal Exam: Not performed.        Assessment:    Normal postpartum exam. Pap smear not done at today's visit.   Plan:   1. Contraception: condoms. Patient is considering copper IUD 2. Patient is medically cleared to resume all activities of daily living 3. Follow up in: 6 months for annual exam or as needed for contraception.

## 2018-03-14 IMAGING — US US OB COMP LESS 14 WK
1 series · 15 of 28 positions shown · non-contrast
Comparison: 01/27/2016

CLINICAL DATA: Uncertain gestational age, right lower quadrant pain

EXAM:
OBSTETRIC <14 WK ULTRASOUND
TECHNIQUE: Transabdominal ultrasound was performed for evaluation of the
gestation as well as the maternal uterus and adnexal regions.

[Series 1: us ob comp less 14 wk · 15 of 42 slices shown]
[im 1/42]
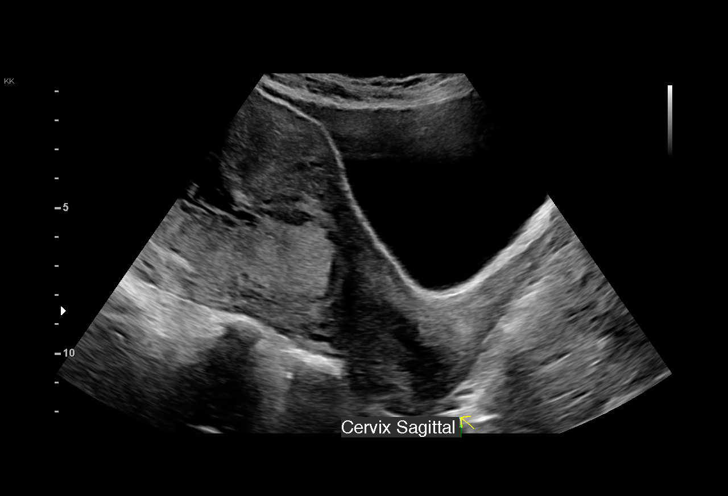
[im 4/42]
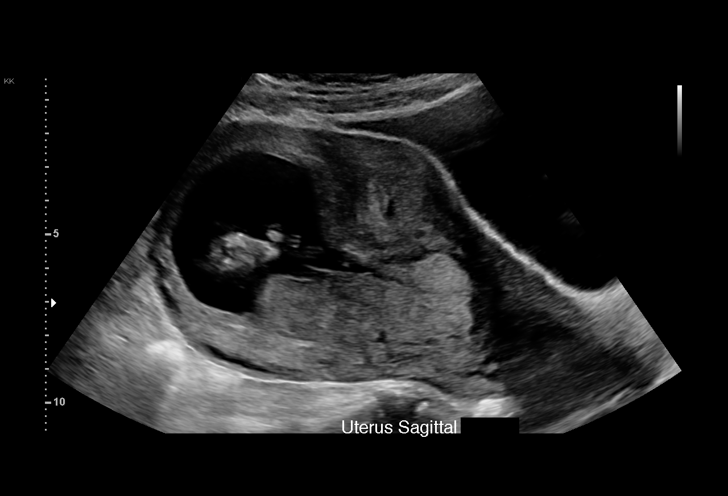
[im 7/42]
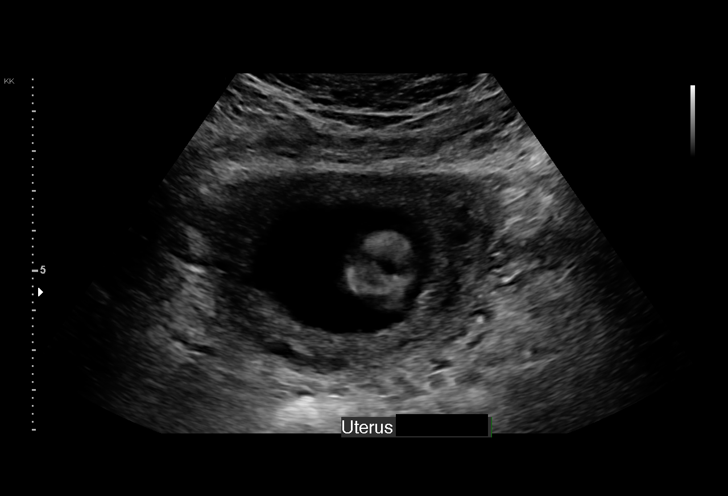
[im 10/42]
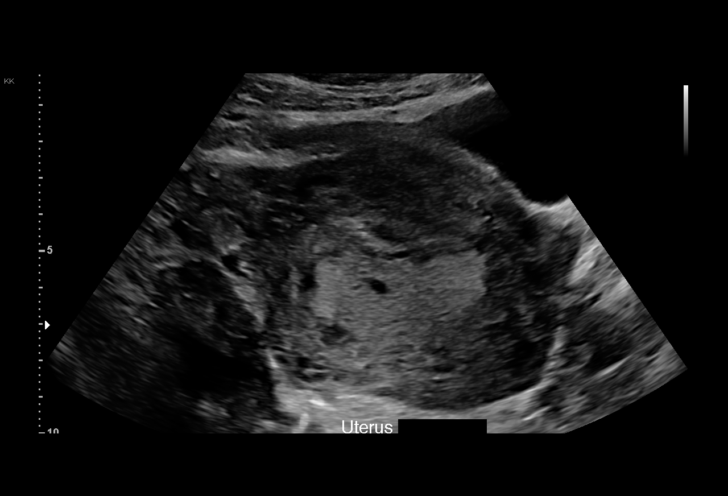
[im 13/42]
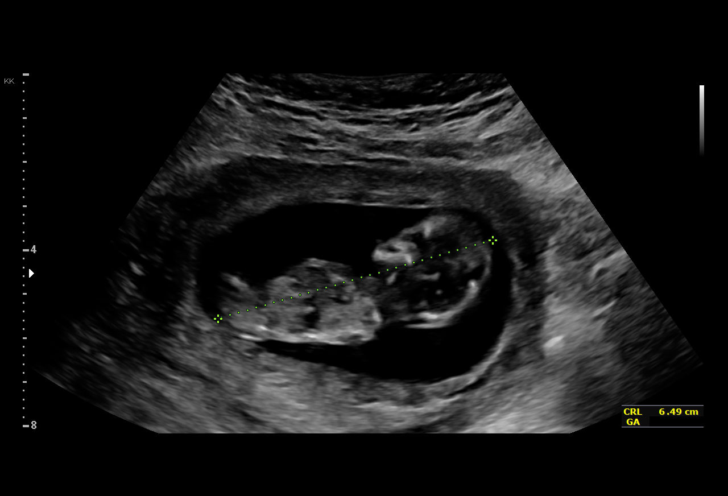
[im 16/42]
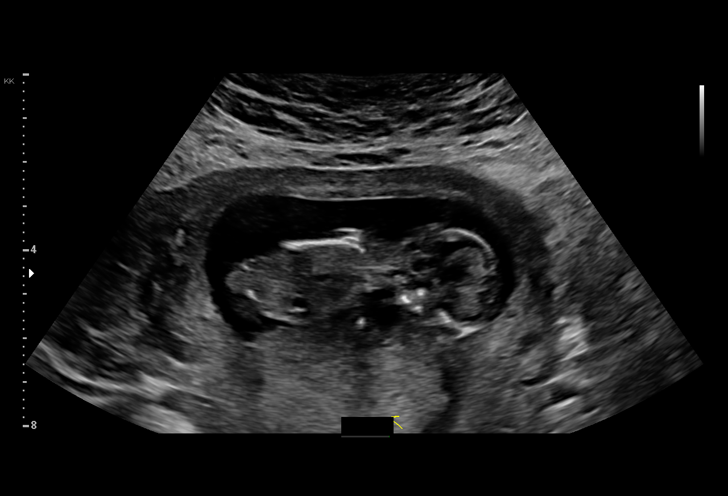
[im 19/42]
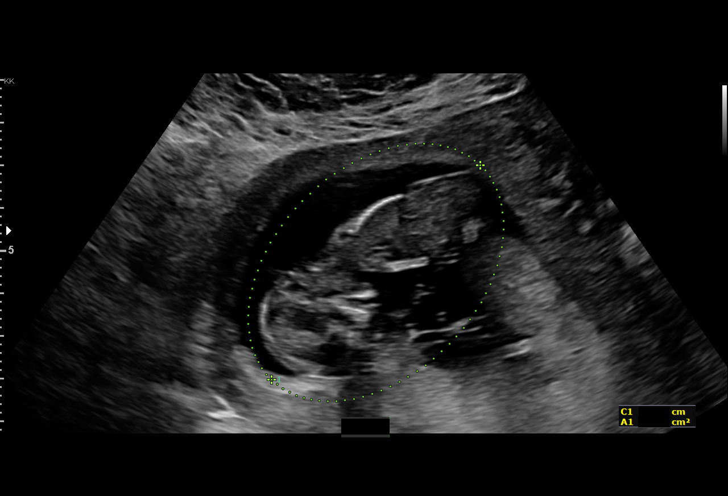
[im 22/42]
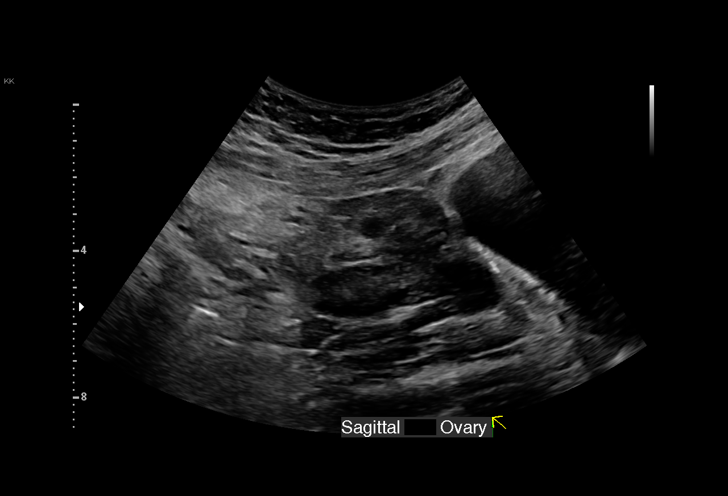
[im 23/42]
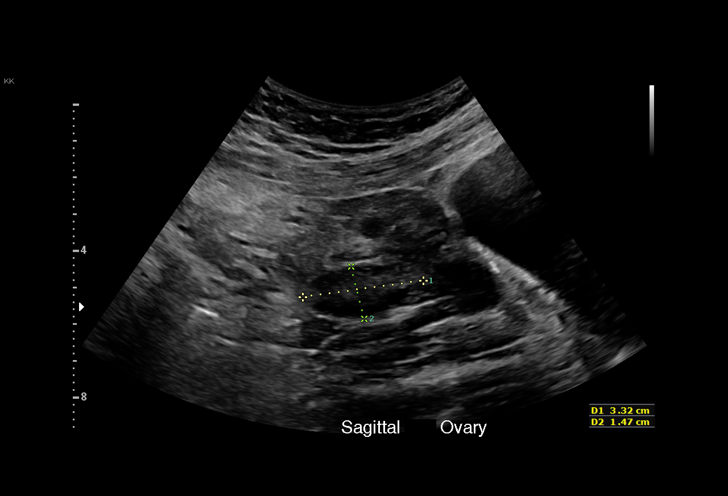
[im 26/42]
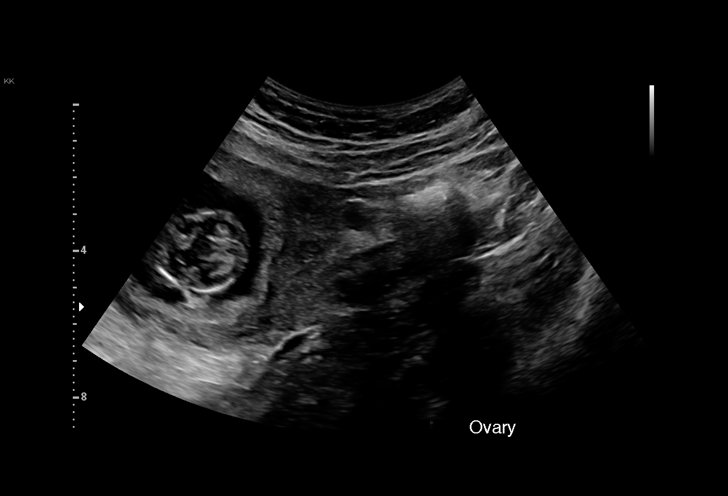
[im 29/42]
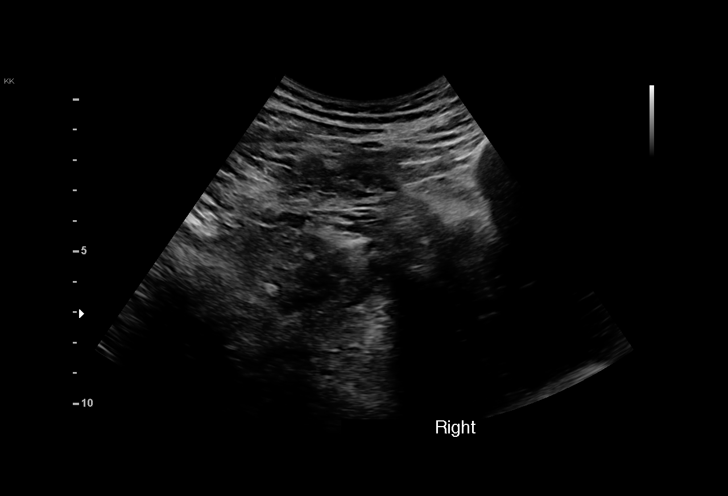
[im 32/42]
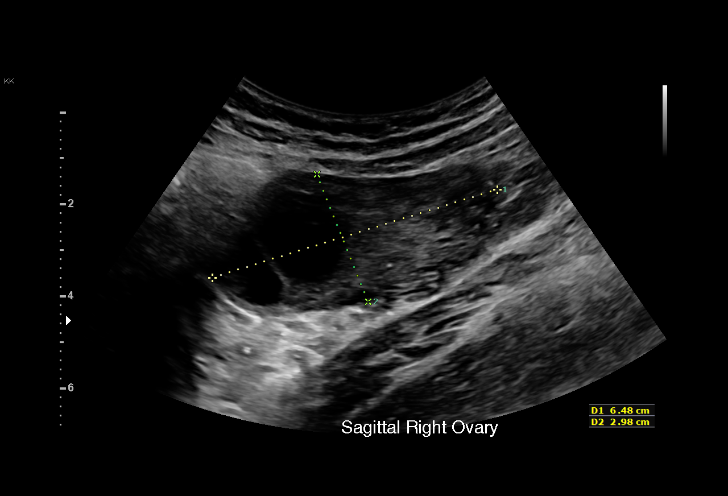
[im 35/42]
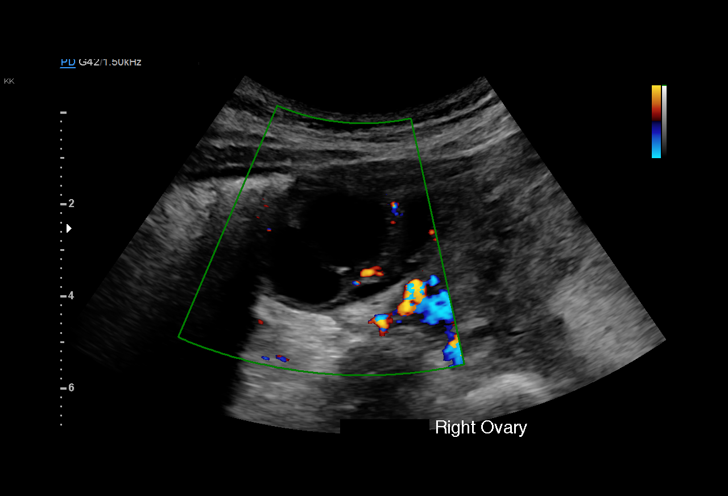
[im 38/42]
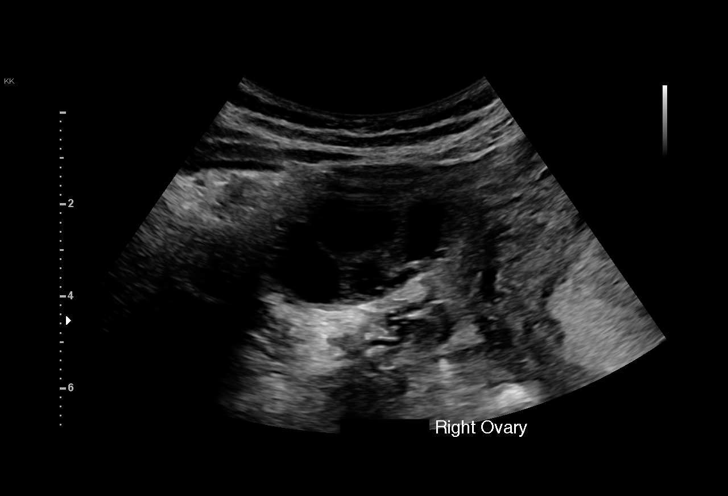
[im 42/42]
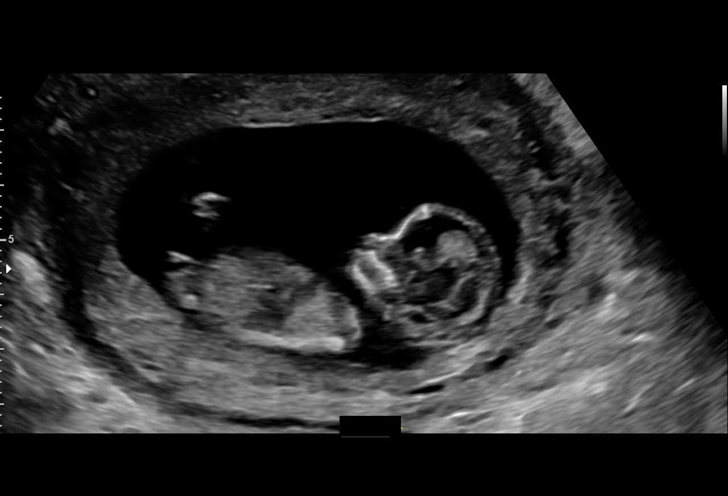

[15 of 28 positions shown; findings below may reference images not displayed]

FINDINGS: Intrauterine gestational sac: Single

Yolk sac:  Not visualized

Embryo:  Visualized

Cardiac Activity: Visualized

Heart Rate: 155 bpm

MSD:   mm    w     d

CRL:   65.1  mm   12 w 6 d                  US EDC: 11/27/2017

Subchorionic hemorrhage:  None visualized.

Maternal uterus/adnexae: No adnexal mass or free fluid.
IMPRESSION: Twelve week 6 day intrauterine pregnancy. Fetal heart rate 155 beats
per minute. No acute maternal findings.

## 2018-08-17 IMAGING — US US MFM OB FOLLOW-UP
1 series · 13 of 28 positions shown · non-contrast
Comparison: none

[Series 1: us mfm ob follow-up · 42 acquisitions, 13 frames shown]
[im 2/42]
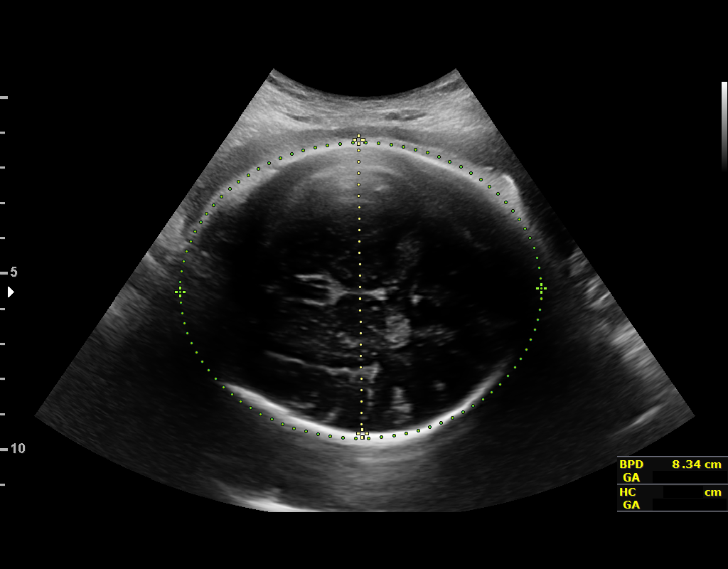
[im 5/42]
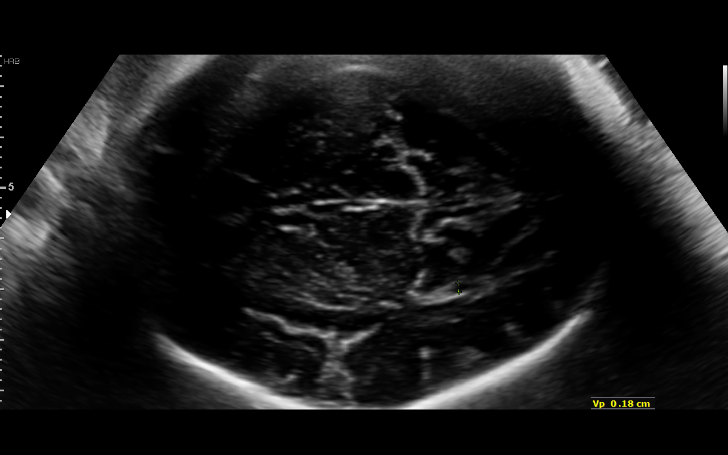
[im 8/42]
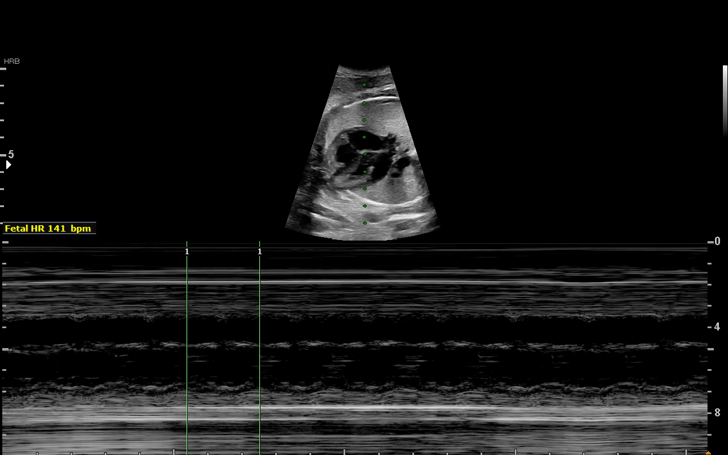
[im 11/42]
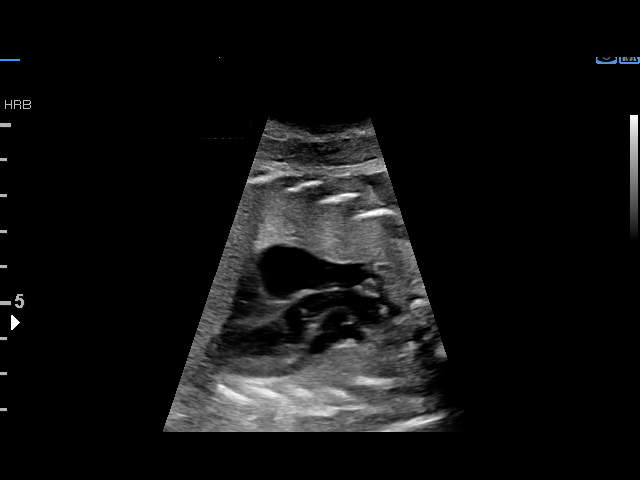
[im 14/42]
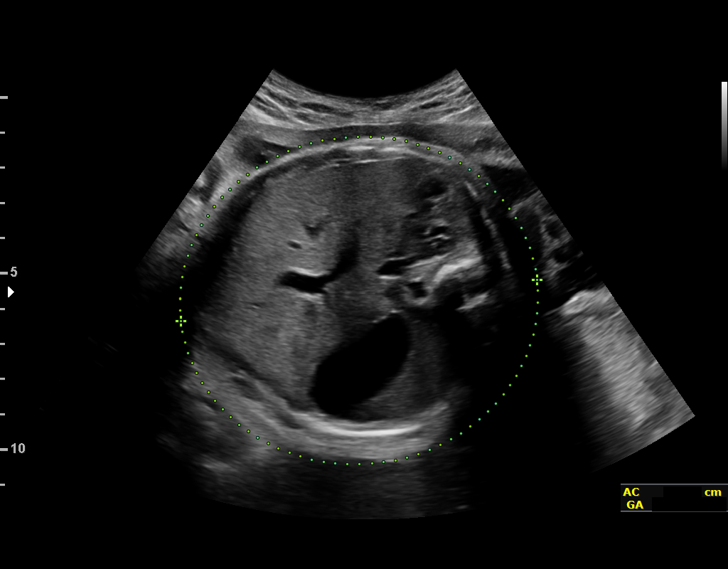
[im 17/42]
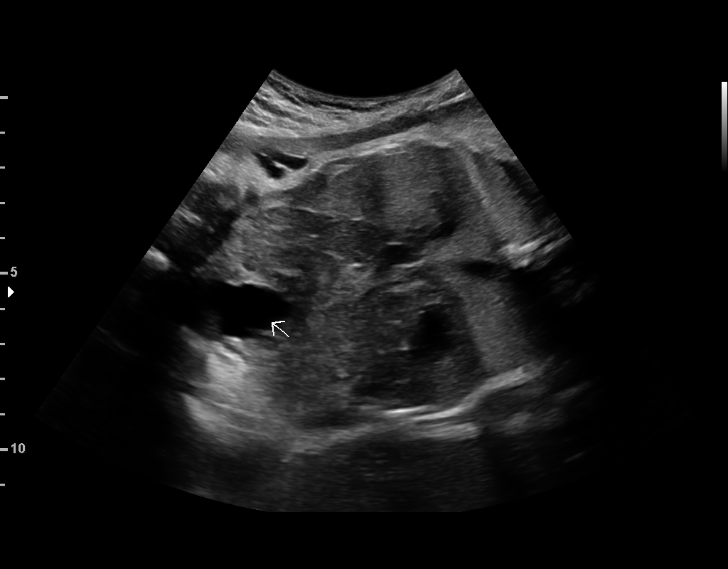
[im 22/42]
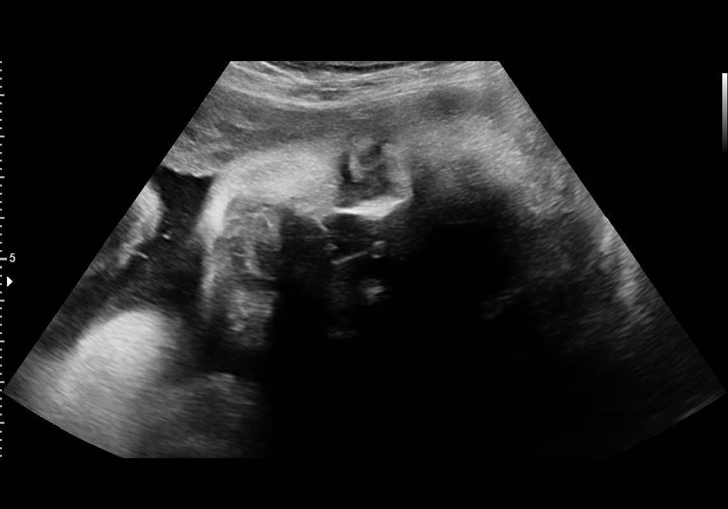
[im 25/42]
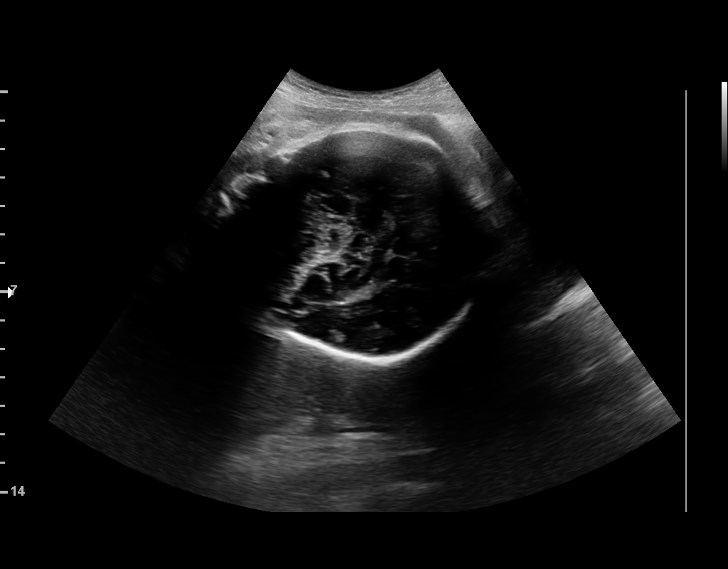
[im 28/42]
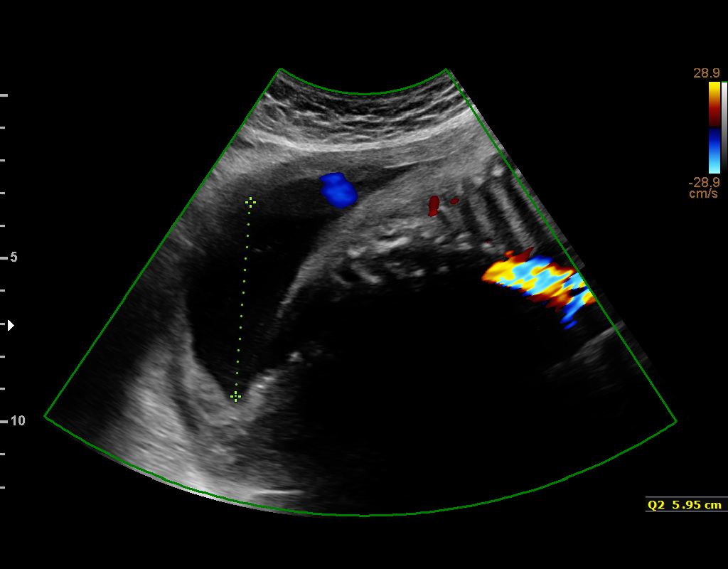
[im 31/42]
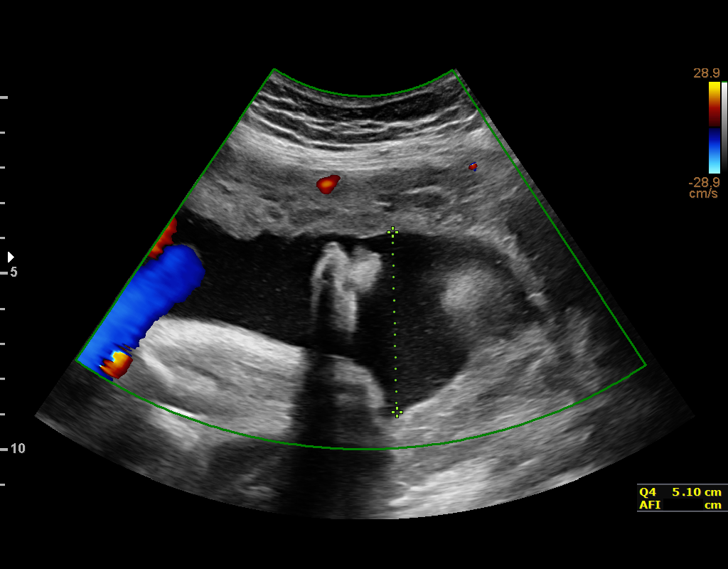
[im 34/42]
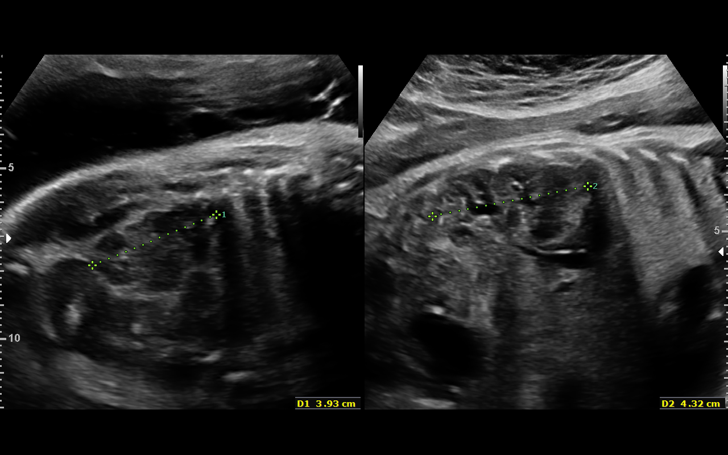
[im 37/42]
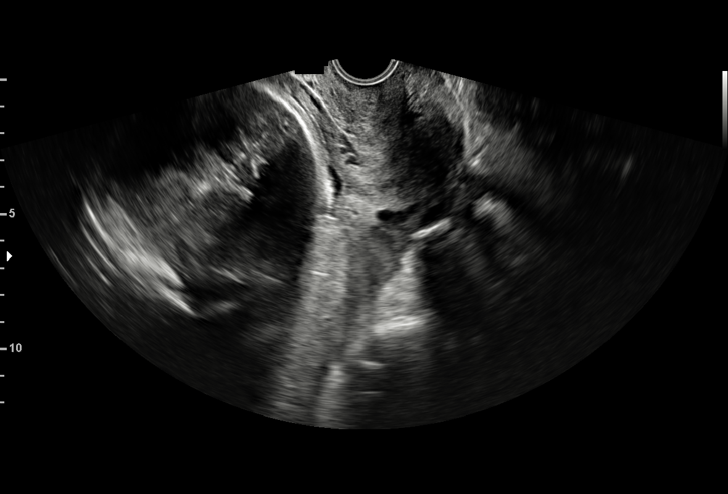
[im 40/42]
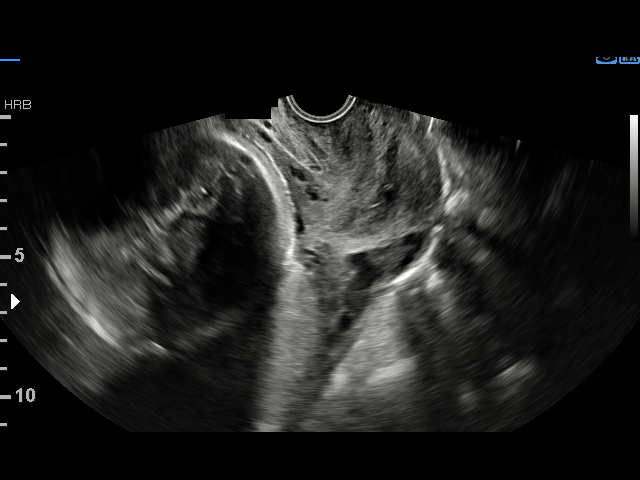

[13 of 28 positions shown; findings below may reference images not displayed]

Road [HOSPITAL]

Indications

34 weeks gestation of pregnancy
Low lying placenta, antepartum
Encounter for other antenatal screening
follow-up
OB History

Gravidity:    3         Term:   2        Prem:   0        SAB:   0
TOP:          0       Ectopic:  0        Living: 2
Fetal Evaluation

Num Of Fetuses:     1
Fetal Heart         141
Rate(bpm):
Cardiac Activity:   Observed
Presentation:       Cephalic
Placenta:           Posterior, low-lying, 1.2cm from int os
P. Cord Insertion:  Previously Visualized

Amniotic Fluid
AFI FV:      Subjectively within normal limits

AFI Sum(cm)     %Tile       Largest Pocket(cm)
16.46           60

RUQ(cm)       RLQ(cm)       LUQ(cm)        LLQ(cm)
3.39
Biometry

BPD:      83.2  mm     G. Age:  33w 3d         17  %    CI:        80.45   %    70 - 86
FL/HC:      23.7   %    20.1 -
HC:       293   mm     G. Age:  32w 2d        < 3  %    HC/AC:      0.96        0.93 -
AC:      305.9  mm     G. Age:  34w 4d         51  %    FL/BPD:     83.3   %    71 - 87
FL:       69.3  mm     G. Age:  35w 4d         64  %    FL/AC:      22.7   %    20 - 24
HUM:      56.9  mm     G. Age:  33w 0d         30  %

Est. FW:    1323  gm      5 lb 6 oz     56  %
Gestational Age

LMP:           34w 5d        Date:  02/23/17                 EDD:   11/30/17
U/S Today:     34w 0d                                        EDD:   12/05/17
Best:          34w 5d     Det. By:  LMP  (02/23/17)          EDD:   11/30/17
Anatomy

Cranium:               Appears normal         Aortic Arch:            Previously seen
Cavum:                 Appears normal         Ductal Arch:            Previously seen
Ventricles:            Appears normal         Diaphragm:              Appears normal
Choroid Plexus:        Previously seen        Stomach:                Appears normal, left
sided
Cerebellum:            Previously seen        Abdomen:                Appears normal
Posterior Fossa:       Previously seen        Abdominal Wall:         Previously seen
Nuchal Fold:           Previously seen        Cord Vessels:           Previously seen
Face:                  Orbits and profile     Kidneys:                Appear normal
previously seen
Lips:                  Previously seen        Bladder:                Appears normal
Thoracic:              Appears normal         Spine:                  Previously seen
Heart:                 Appears normal         Upper Extremities:      Previously seen
(4CH, axis, and situs
RVOT:                  Appears normal         Lower Extremities:      Previously seen
LVOT:                  Appears normal

Other:  Parents do not wish to know sex of fetus. Heels prev visualized.
Cervix Uterus Adnexa

Cervix
Normal appearance by transvaginal scan

Uterus
No abnormality visualized.
Comments

The head circumference measures <3%ile but does not meet
criteria for microcephaly (=>3SD below the mean for
gestational age).
Impression

Single living intrauterine pregnancy at 34w 5d.
Cephalic presentation.
Placenta Posterior, low-lying, 1.2cm from internal os. No
sonographic evidence of vasa previa.
Normal amniotic fluid volume.
Appropriate interval fetal growth.
Normal interval fetal anatomy.
Recommendations

Findings of today's ultrasound reviewed. Placenta previa
resolved, now low-lying. Recommend follow-up ultrasound in
2 weeks to reassess placental location. If delivery indicated in
the interim, vaginal delivery may be considered in the
absence of significant maternal bleeding.

## 2019-05-21 ENCOUNTER — Ambulatory Visit (HOSPITAL_COMMUNITY)
Admission: EM | Admit: 2019-05-21 | Discharge: 2019-05-21 | Disposition: A | Discharge status: Home / Self Care | Payer: Medicaid Other | Attending: Nurse Practitioner | Admitting: Nurse Practitioner

## 2019-05-21 ENCOUNTER — Encounter (HOSPITAL_COMMUNITY): Payer: Self-pay

## 2019-05-21 ENCOUNTER — Other Ambulatory Visit: Payer: Self-pay

## 2019-05-21 DIAGNOSIS — N760 Acute vaginitis: Secondary | ICD-10-CM | POA: Diagnosis not present

## 2019-05-21 MED ORDER — CLINDAMYCIN HCL 300 MG PO CAPS: 300.0000 mg | ORAL_CAPSULE | Freq: Two times a day (BID) | ORAL | 0 refills | Status: AC

## 2019-05-21 MED ORDER — FLUCONAZOLE 150 MG PO TABS: 150.0000 mg | ORAL_TABLET | ORAL | 0 refills | Status: AC

## 2019-05-21 NOTE — ED Triage Notes (Signed)
Pt states she has vaginal discharge and odor x 1 week.

## 2019-05-21 NOTE — ED Provider Notes (Signed)
[facility]    CSN: [id number] Arrival date & time: 05/21/19  1201      History   Chief Complaint Chief Complaint  Patient presents with   Vaginal Discharge    HPI Diane [name] is a 28 y.o. female.   Subjective:   Diane [name] is a 28 y.o. female who presents for evaluation of vaginal symptoms of itching and perineal odor. Symptoms have been present for 1 week. Symptoms include local irritation and vulvar itching. She denies any vaginal discharge, dysuria, hematuria, sores, back pain, flank pain, nausea, vomiting or fevers. Menstrual pattern: bleeding regularly, LMP ended about a week ago. Contraception: none. Sexually active with one female partner. Denies any concerns for STI Risk.   The following portions of the patient's history were reviewed and updated as appropriate: allergies, current medications, past family history, past medical history, past social history, past surgical history and problem list.       Past Medical History:  Diagnosis Date   Anemia    pt's states not anemic   Headache    Migraines - not on meds    Patient Active Problem List   Diagnosis Date Noted   Encounter for induction of labor 11/23/2017   Low-lying placenta 07/09/2017   Supervision of normal pregnancy, antepartum 05/13/2017   Dyspareunia in female 09/13/2016   Dysmenorrhea 01/30/2016    Past Surgical History:  Procedure Laterality Date   LAPAROSCOPIC LYSIS OF ADHESIONS N/A 07/30/2016   Procedure: LAPAROSCOPIC LYSIS OF ADHESIONS;  Surgeon: [place] Bingharlie Pickens, MD;  Location: [facility];  Service: Gynecology;  Laterality: N/A;   LAPAROSCOPY N/A 07/30/2016   Procedure: LAPAROSCOPY DIAGNOSTIC;  Surgeon: [place] Bingharlie Pickens, MD;  Location: [facility];  Service: Gynecology;  Laterality: N/A;   WISDOM TOOTH EXTRACTION      OB History    Gravida  3   Para  3   Term  3   Preterm      AB      Living  3     SAB      TAB      Ectopic      Multiple  0     Live Births  3        Obstetric Comments  SVD x 2         Home Medications    Prior to Admission medications   Medication Sig Start Date End Date Taking? Authorizing Provider  clindamycin (CLEOCIN) 300 MG capsule Take 1 capsule (300 mg total) by mouth 2 (two) times daily for 7 days. 05/21/19 05/28/19  Lurline IdolMurrill, Myliyah Rebuck, FNP  fluconazole (DIFLUCAN) 150 MG tablet Take 1 tablet (150 mg total) by mouth every 3 (three) days for 2 doses. 05/21/19 05/25/19  Lurline IdolMurrill, Tracker Mance, FNP  ibuprofen (ADVIL,MOTRIN) 600 MG tablet Take 1 tablet (600 mg total) by mouth every 6 (six) hours. 11/25/17   Levie HeritageStinson, Jacob J, DO  Prenatal Vit-Fe Fumarate-FA (PRENATAL MULTIVITAMIN) TABS tablet Take 1 tablet by mouth daily at 12 noon.    [provider]    Family History Family History  Problem Relation Age of Onset   Diabetes Mother    Diabetes Maternal Grandmother     Social History Social History   Tobacco Use   Smoking status: Never Smoker   Smokeless tobacco: Never Used  Substance Use Topics   Alcohol use: No   Drug use: No     Allergies   Flagyl [metronidazole]   Review of Systems Review of Systems  Constitutional: Negative for fever.  Genitourinary: Negative for decreased urine volume, dysuria, flank pain, frequency, genital sores, urgency, vaginal bleeding, vaginal discharge and vaginal pain.  Musculoskeletal: Negative for back pain.  Neurological: Negative.   All other systems reviewed and are negative.    Physical Exam Triage Vital Signs ED Triage Vitals  Enc Vitals Group     BP 05/21/19 1224 110/75     Pulse Rate 05/21/19 1224 89     Resp 05/21/19 1224 18     Temp 05/21/19 1224 98.1 F (36.7 C)     Temp Source 05/21/19 1224 Oral     SpO2 05/21/19 1224 100 %     Weight 05/21/19 1221 140 lb (63.5 kg)     Height --      Head Circumference --      Peak Flow --      Pain Score 05/21/19 1221 0     Pain Loc --      Pain Edu? --      Excl. in GC? --    No  data found.  Updated Vital Signs BP 110/75 (BP Location: Right Arm)    Pulse 89    Temp 98.1 F (36.7 C) (Oral)    Resp 18    Wt 140 lb (63.5 kg)    LMP 05/16/2019    SpO2 100%    BMI 23.30 kg/m   Visual Acuity Right Eye Distance:   Left Eye Distance:   Bilateral Distance:    Right Eye Near:   Left Eye Near:    Bilateral Near:     Physical Exam Vitals signs reviewed.  Constitutional:      General: She is not in acute distress.    Appearance: Normal appearance. She is not ill-appearing, toxic-appearing or diaphoretic.  Neck:     Musculoskeletal: Normal range of motion and neck supple.  Cardiovascular:     Rate and Rhythm: Normal rate and regular rhythm.  Pulmonary:     Effort: Pulmonary effort is normal.     Breath sounds: Normal breath sounds.  Abdominal:     General: Bowel sounds are normal. There is no distension.     Palpations: Abdomen is soft.  Musculoskeletal: Normal range of motion.  Skin:    General: Skin is warm and dry.  Neurological:     General: No focal deficit present.     Mental Status: She is alert and oriented to person, place, and time.  Psychiatric:        Mood and Affect: Mood normal.      UC Treatments / Results  Labs (all labs ordered are listed, but only abnormal results are displayed) Labs Reviewed  CERVICOVAGINAL ANCILLARY ONLY    EKG   Radiology No results found.  Procedures Procedures (including critical care time)  Medications Ordered in UC Medications - No data to display  Initial Impression / Assessment and Plan / UC Course  I have reviewed the triage vital signs and the nursing notes.  Pertinent labs & imaging results that were available during my care of the patient were reviewed by me and considered in my medical decision making (see chart for details).    28 y.o. female who presents for evaluation of a one-week history of vaginal itching, odor, local irritation and vulvar itching. No vaginal discharge, dysuria,  hematuria, sores, back pain, flank pain, nausea, vomiting or fevers. Sexually active but denies any concerns for STI risk. Patient decided against empiric treatment for GC/Chlamydia and would  rather wait until the results to determine if further treatment for this is needed. Will terat with oral antifungals and antibiotics for possible BV and/or yeast.   Today's evaluation has revealed no signs of a dangerous process. Discussed diagnosis with patient and/or guardian. Patient and/or guardian aware of their diagnosis, possible red flag symptoms to watch out for and need for close follow up. Patient and/or guardian understands verbal and written discharge instructions. Patient and/or guardian comfortable with plan and disposition.  Patient and/or guardian has a clear mental status at this time, good insight into illness (after discussion and teaching) and has clear judgment to make decisions regarding their care  This care was provided during an unprecedented National Emergency due to the Novel Coronavirus (COVID-19) pandemic. COVID-19 infections and transmission risks place heavy strains on healthcare resources.  As this pandemic evolves, our facility, providers, and staff strive to respond fluidly, to remain operational, and to provide care relative to available resources and information. Outcomes are unpredictable and treatments are without well-defined guidelines. Further, the impact of COVID-19 on all aspects of urgent care, including the impact to patients seeking care for reasons other than COVID-19, is unavoidable during this national emergency. At this time of the global pandemic, management of patients has significantly changed, even for non-COVID positive patients given high local and regional COVID volumes at this time requiring high healthcare system and resource utilization. The standard of care for management of both COVID suspected and non-COVID suspected patients continues to change rapidly at the  local, regional, national, and global levels. This patient was worked up and treated to the best available but ever changing evidence and resources available at this current time.   Documentation was completed with the aid of voice recognition software. Transcription may contain typographical errors.  Final Clinical Impressions(s) / UC Diagnoses   Final diagnoses:  Acute vaginitis     Discharge Instructions     Take medications as prescribed to treat for possible bacterial vaginosis and/or yeast infection. We will hold off on treatment for any possible STDs until your test results are back. You will only be contacted for possible results. You may go online to MyChart in a couple of days to view all results.     ED Prescriptions    Medication Sig Dispense Auth. Provider   clindamycin (CLEOCIN) 300 MG capsule Take 1 capsule (300 mg total) by mouth 2 (two) times daily for 7 days. 14 capsule Enrique Sack, FNP   fluconazole (DIFLUCAN) 150 MG tablet Take 1 tablet (150 mg total) by mouth every 3 (three) days for 2 doses. 2 tablet Enrique Sack, FNP     PDMP not reviewed this encounter.   Enrique Sack, [place] 05/21/19 1348

## 2019-05-21 NOTE — Discharge Instructions (Signed)
Take medications as prescribed to treat for possible bacterial vaginosis and/or yeast infection. We will hold off on treatment for any possible STDs until your test results are back. You will only be contacted for possible results. You may go online to MyChart in a couple of days to view all results.

## 2019-05-25 LAB — CERVICOVAGINAL ANCILLARY ONLY
Bacterial vaginitis: POSITIVE — AB
Candida vaginitis: NEGATIVE
Chlamydia: NEGATIVE
Neisseria Gonorrhea: NEGATIVE
Trichomonas: NEGATIVE

## 2019-08-16 ENCOUNTER — Encounter (HOSPITAL_COMMUNITY): Payer: Self-pay

## 2019-08-16 ENCOUNTER — Ambulatory Visit (HOSPITAL_COMMUNITY)
Admission: EM | Admit: 2019-08-16 | Discharge: 2019-08-16 | Disposition: A | Discharge status: Home / Self Care | Payer: Medicaid Other | Attending: Family Medicine | Admitting: Family Medicine

## 2019-08-16 ENCOUNTER — Other Ambulatory Visit: Payer: Self-pay

## 2019-08-16 DIAGNOSIS — B9689 Other specified bacterial agents as the cause of diseases classified elsewhere: Secondary | ICD-10-CM

## 2019-08-16 DIAGNOSIS — N760 Acute vaginitis: Secondary | ICD-10-CM

## 2019-08-16 MED ORDER — METRONIDAZOLE 0.75 % VA GEL: 1.0000 | Freq: Two times a day (BID) | VAGINAL | 1 refills | Status: DC

## 2019-08-16 NOTE — Discharge Instructions (Signed)
Take a probiotic for women once a day Use the MetroGel as directed I have given you a refill in case you need it again  It was nice meeting you

## 2019-08-16 NOTE — ED Triage Notes (Signed)
Pt states she has vaginal discharge and odor. X 2 weeks.

## 2019-08-16 NOTE — ED Provider Notes (Signed)
[facility]    CSN: [id number] Arrival date & time: 08/16/19  1027      History   Chief Complaint Chief Complaint  Patient presents with  . Vaginal Discharge    HPI Diane [name] is a 29 y.o. female.   HPI  Patient is here for recurring BV.  She has a vaginal odor for 2 weeks.  Minimal discomfort.  No abdominal pain.  No fever.  No dysuria.  No urinary symptoms.  Is certain that she does not have an STI.  Does not desire STI testing  Past Medical History:  Diagnosis Date  . Anemia    pt's states not anemic  . Headache    Migraines - not on meds    Patient Active Problem List   Diagnosis Date Noted  . Encounter for induction of labor 11/23/2017  . Low-lying placenta 07/09/2017  . Supervision of normal pregnancy, antepartum 05/13/2017  . Dyspareunia in female 09/13/2016  . Dysmenorrhea 01/30/2016    Past Surgical History:  Procedure Laterality Date  . LAPAROSCOPIC LYSIS OF ADHESIONS N/A 07/30/2016   Procedure: LAPAROSCOPIC LYSIS OF ADHESIONS;  Surgeon: Aletha Halim, MD;  Location: [facility];  Service: Gynecology;  Laterality: N/A;  . LAPAROSCOPY N/A 07/30/2016   Procedure: LAPAROSCOPY DIAGNOSTIC;  Surgeon: Aletha Halim, MD;  Location: [facility];  Service: Gynecology;  Laterality: N/A;  . WISDOM TOOTH EXTRACTION      OB History    Gravida  3   Para  3   Term  3   Preterm      AB      Living  3     SAB      TAB      Ectopic      Multiple  0   Live Births  3        Obstetric Comments  SVD x 2         Home Medications    Prior to Admission medications   Medication Sig Start Date End Date Taking? Authorizing Provider  ibuprofen (ADVIL,MOTRIN) 600 MG tablet Take 1 tablet (600 mg total) by mouth every 6 (six) hours. 11/25/17   Truett Mainland, DO  metroNIDAZOLE (METROGEL VAGINAL) 0.75 % vaginal gel Place 1 Applicatorful vaginally 2 (two) times daily. 08/16/19   Raylene Everts, MD  Prenatal Vit-Fe Fumarate-FA (PRENATAL  MULTIVITAMIN) TABS tablet Take 1 tablet by mouth daily at 12 noon.    [provider]    Family History Family History  Problem Relation Age of Onset  . Diabetes Mother   . Diabetes Maternal Grandmother     Social History Social History   Tobacco Use  . Smoking status: Never Smoker  . Smokeless tobacco: Never Used  Substance Use Topics  . Alcohol use: No  . Drug use: No     Allergies   Flagyl [metronidazole]   Review of Systems Review of Systems  Constitutional: Negative for chills and fever.  HENT: Negative for congestion and hearing loss.   Eyes: Negative for pain.  Respiratory: Negative for cough and shortness of breath.   Cardiovascular: Negative for chest pain and leg swelling.  Gastrointestinal: Negative for abdominal pain, constipation and diarrhea.  Genitourinary: Negative for dysuria and frequency.  Musculoskeletal: Negative for myalgias.  Neurological: Negative for dizziness, seizures and headaches.  Psychiatric/Behavioral: The patient is not nervous/anxious.      Physical Exam Triage Vital Signs ED Triage Vitals  Enc Vitals Group     BP  08/16/19 1058 120/76     Pulse Rate 08/16/19 1058 78     Resp 08/16/19 1058 16     Temp 08/16/19 1058 98.1 F (36.7 C)     Temp Source 08/16/19 1058 Oral     SpO2 08/16/19 1058 98 %     Weight 08/16/19 1101 130 lb (59 kg)     Height --      Head Circumference --      Peak Flow --      Pain Score 08/16/19 1101 0     Pain Loc --      Pain Edu? --      Excl. in GC? --    No data found.  Updated Vital Signs BP 120/76 (BP Location: Left Arm)   Pulse 78   Temp 98.1 F (36.7 C) (Oral)   Resp 16   Wt 59 kg   LMP 08/04/2019   SpO2 98%   BMI 21.63 kg/m   Visual Acuity Right Eye Distance:   Left Eye Distance:   Bilateral Distance:    Right Eye Near:   Left Eye Near:    Bilateral Near:     Physical Exam Constitutional:      General: She is not in acute distress.    Appearance: She is  well-developed.  HENT:     Head: Normocephalic and atraumatic.  Eyes:     Conjunctiva/sclera: Conjunctivae normal.     Pupils: Pupils are equal, round, and reactive to light.  Cardiovascular:     Rate and Rhythm: Normal rate.  Pulmonary:     Effort: Pulmonary effort is normal. No respiratory distress.  Abdominal:     General: There is no distension.     Palpations: Abdomen is soft.  Genitourinary:    Comments: Physical exam not indicated Musculoskeletal:        General: Normal range of motion.     Cervical back: Normal range of motion.  Skin:    General: Skin is warm and dry.  Neurological:     General: No focal deficit present.     Mental Status: She is alert.  Psychiatric:        Mood and Affect: Mood normal.      UC Treatments / Results  Labs (all labs ordered are listed, but only abnormal results are displayed) Labs Reviewed - No data to display  EKG   Radiology No results found.  Procedures Procedures (including critical care time)  Medications Ordered in UC Medications - No data to display  Initial Impression / Assessment and Plan / UC Course  I have reviewed the triage vital signs and the nursing notes.  Pertinent labs & imaging results that were available during my care of the patient were reviewed by me and considered in my medical decision making (see chart for details).     Discussed recurring BV.  Causes.  Prevention. Final Clinical Impressions(s) / UC Diagnoses   Final diagnoses:  BV (bacterial vaginosis)     Discharge Instructions     Take a probiotic for women once a day Use the MetroGel as directed I have given you a refill in case you need it again  It was nice meeting you   ED Prescriptions    Medication Sig Dispense Auth. Provider   metroNIDAZOLE (METROGEL VAGINAL) 0.75 % vaginal gel Place 1 Applicatorful vaginally 2 (two) times daily. 70 g Eustace Moore, MD     PDMP not reviewed this encounter.   Rica Mast  Fannie Knee,  MD 08/16/19 1155

## 2020-11-24 ENCOUNTER — Encounter (HOSPITAL_COMMUNITY): Payer: Self-pay

## 2020-11-24 ENCOUNTER — Ambulatory Visit (HOSPITAL_COMMUNITY)
Admission: EM | Admit: 2020-11-24 | Discharge: 2020-11-24 | Disposition: A | Discharge status: Home / Self Care | Payer: Medicaid Other | Attending: Emergency Medicine | Admitting: Emergency Medicine

## 2020-11-24 ENCOUNTER — Other Ambulatory Visit: Payer: Self-pay

## 2020-11-24 DIAGNOSIS — N898 Other specified noninflammatory disorders of vagina: Secondary | ICD-10-CM | POA: Diagnosis present

## 2020-11-24 NOTE — Discharge Instructions (Addendum)
Lab results 2-3 days, will be called if positive for treatment  Can use boric acid suppository for symptoms relief until labs result

## 2020-11-24 NOTE — ED Triage Notes (Signed)
Pt presents with white vaginal discharge x 1 week.

## 2020-11-24 NOTE — ED Provider Notes (Signed)
[facility]    CSN: [id number] Arrival date & time: 11/24/20  1310      History   Chief Complaint Chief Complaint  Patient presents with  . Vaginal Discharge    HPI Diane [name] is a 30 y.o. female.   Patient presents with "cottage cheese" vaginal discharge for one week. Denies itching, odor, frequency, urgency, abdominal pain and flank pain. Sexually active. One partner. History of BV.   Past Medical History:  Diagnosis Date  . Anemia    pt's states not anemic  . Headache    Migraines - not on meds    Patient Active Problem List   Diagnosis Date Noted  . Encounter for induction of labor 11/23/2017  . Low-lying placenta 07/09/2017  . Supervision of normal pregnancy, antepartum 05/13/2017  . Dyspareunia in female 09/13/2016  . Dysmenorrhea 01/30/2016    Past Surgical History:  Procedure Laterality Date  . LAPAROSCOPIC LYSIS OF ADHESIONS N/A 07/30/2016   Procedure: LAPAROSCOPIC LYSIS OF ADHESIONS;  Surgeon: Wayland Bing, MD;  Location: [facility];  Service: Gynecology;  Laterality: N/A;  . LAPAROSCOPY N/A 07/30/2016   Procedure: LAPAROSCOPY DIAGNOSTIC;  Surgeon: [place] Bing, MD;  Location: [facility];  Service: Gynecology;  Laterality: N/A;  . WISDOM TOOTH EXTRACTION      OB History    Gravida  3   Para  3   Term  3   Preterm      AB      Living  3     SAB      IAB      Ectopic      Multiple  0   Live Births  3        Obstetric Comments  SVD x 2         Home Medications    Prior to Admission medications   Medication Sig Start Date End Date Taking? Authorizing Provider  ibuprofen (ADVIL,MOTRIN) 600 MG tablet Take 1 tablet (600 mg total) by mouth every 6 (six) hours. 11/25/17   Levie Heritage, DO  metroNIDAZOLE (METROGEL VAGINAL) 0.75 % vaginal gel Place 1 Applicatorful vaginally 2 (two) times daily. 08/16/19   Eustace Moore, MD  Prenatal Vit-Fe Fumarate-FA (PRENATAL MULTIVITAMIN) TABS tablet Take 1 tablet by mouth  daily at 12 noon.    [provider]    Family History Family History  Problem Relation Age of Onset  . Diabetes Mother   . Diabetes Maternal Grandmother     Social History Social History   Tobacco Use  . Smoking status: Never Smoker  . Smokeless tobacco: Never Used  Vaping Use  . Vaping Use: Never used  Substance Use Topics  . Alcohol use: No  . Drug use: No     Allergies   Flagyl [metronidazole]   Review of Systems Review of Systems  Constitutional: Negative.   Respiratory: Negative.   Cardiovascular: Negative.   Genitourinary: Positive for vaginal discharge. Negative for decreased urine volume, difficulty urinating, dyspareunia, dysuria, enuresis, flank pain, frequency, genital sores, hematuria, menstrual problem, pelvic pain, urgency, vaginal bleeding and vaginal pain.  Skin: Negative.   Neurological: Negative.      Physical Exam Triage Vital Signs ED Triage Vitals  Enc Vitals Group     BP 11/24/20 1439 112/68     Pulse Rate 11/24/20 1439 92     Resp 11/24/20 1439 17     Temp 11/24/20 1439 99.2 F (37.3 C)     Temp Source 11/24/20  1439 Oral     SpO2 11/24/20 1439 100 %     Weight --      Height --      Head Circumference --      Peak Flow --      Pain Score 11/24/20 1438 0     Pain Loc --      Pain Edu? --      Excl. in GC? --    No data found.  Updated Vital Signs BP 112/68 (BP Location: Right Arm)   Pulse 92   Temp 99.2 F (37.3 C) (Oral)   Resp 17   LMP  (Within Weeks) Comment: 3 weeks  SpO2 100%   Visual Acuity Right Eye Distance:   Left Eye Distance:   Bilateral Distance:    Right Eye Near:   Left Eye Near:    Bilateral Near:     Physical Exam Constitutional:      Appearance: Normal appearance. She is normal weight.  HENT:     Head: Normocephalic.  Eyes:     Extraocular Movements: Extraocular movements intact.  Pulmonary:     Effort: Pulmonary effort is normal.  Musculoskeletal:        General: Normal range of  motion.  Skin:    General: Skin is warm and dry.  Neurological:     Mental Status: She is alert and oriented to person, place, and time. Mental status is at baseline.  Psychiatric:        Mood and Affect: Mood normal.        Behavior: Behavior normal.        Thought Content: Thought content normal.        Judgment: Judgment normal.      UC Treatments / Results  Labs (all labs ordered are listed, but only abnormal results are displayed) Labs Reviewed - No data to display  EKG   Radiology No results found.  Procedures Procedures (including critical care time)  Medications Ordered in UC Medications - No data to display  Initial Impression / Assessment and Plan / UC Course  I have reviewed the triage vital signs and the nursing notes.  Pertinent labs & imaging results that were available during my care of the patient were reviewed by me and considered in my medical decision making (see chart for details).  Vaginal discharge  1. sti screening- pending, will treat per protocol 2. Patient adament about receiving medication for treatment today, discussed antibiotic medications will be prescribed if lab result are positive,  Advised boric acid suppositories until lab results  Final Clinical Impressions(s) / UC Diagnoses   Final diagnoses:  Vaginal discharge     Discharge Instructions     Lab results 2-3 days, will be called if positive for treatment  Can use boric acid suppository for symptoms relief until labs result   ED Prescriptions    None     PDMP not reviewed this encounter.   Valinda Hoar, NP 11/24/20 1507

## 2020-11-25 ENCOUNTER — Telehealth (HOSPITAL_COMMUNITY): Payer: Self-pay | Admitting: Emergency Medicine

## 2020-11-25 LAB — CERVICOVAGINAL ANCILLARY ONLY
Bacterial Vaginitis (gardnerella): NEGATIVE
Candida Glabrata: NEGATIVE
Candida Vaginitis: POSITIVE — AB
Chlamydia: NEGATIVE
Comment: NEGATIVE
Comment: NEGATIVE
Comment: NEGATIVE
Comment: NEGATIVE
Comment: NEGATIVE
Comment: NORMAL
Neisseria Gonorrhea: NEGATIVE
Trichomonas: NEGATIVE

## 2020-11-25 MED ORDER — FLUCONAZOLE 150 MG PO TABS: 150.0000 mg | ORAL_TABLET | Freq: Once | ORAL | 0 refills | Status: AC

## 2021-04-25 ENCOUNTER — Encounter (HOSPITAL_COMMUNITY): Payer: Self-pay

## 2021-04-25 ENCOUNTER — Other Ambulatory Visit: Payer: Self-pay

## 2021-04-25 ENCOUNTER — Ambulatory Visit (HOSPITAL_COMMUNITY)
Admission: EM | Admit: 2021-04-25 | Discharge: 2021-04-25 | Disposition: A | Discharge status: Home / Self Care | Payer: Medicaid Other | Attending: Emergency Medicine | Admitting: Emergency Medicine

## 2021-04-25 DIAGNOSIS — N760 Acute vaginitis: Secondary | ICD-10-CM | POA: Diagnosis not present

## 2021-04-25 DIAGNOSIS — B9689 Other specified bacterial agents as the cause of diseases classified elsewhere: Secondary | ICD-10-CM | POA: Diagnosis not present

## 2021-04-25 MED ORDER — METRONIDAZOLE 0.75 % VA GEL: 1.0000 | Freq: Every day | VAGINAL | 0 refills | Status: AC

## 2021-04-25 NOTE — Discharge Instructions (Addendum)
Please begin metronidazole as prescribed.  You will be notified of the results of your Optima swab once they are received.

## 2021-04-25 NOTE — ED Provider Notes (Addendum)
[facility]    CSN: [id number] Arrival date & time: 04/25/21  0818      History   Chief Complaint Chief Complaint  Patient presents with   Vaginal Discharge    HPI Diane [name] is a 30 y.o. female.   Patient presents to the urgent care today complaining of vaginal irritation and a fishy vaginal odor.  Reports of history of bacterial vaginitis in the past, states her symptoms today are similar.  Denies known exposure to sexually transmitted disease.  Denies pelvic pain, genital lesion, excessive vaginal discharge, burning with urination urinary frequency, urinary urgency incomplete emptying dyspareunia.  Patient denies fever, aches, chills, nausea, vomiting, diarrhea, rash, headache.   Vaginal Discharge Quality:  Unable to specify Severity:  Unable to specify Onset quality:  Unable to specify Duration:  3 days Timing:  Unable to specify Progression:  Unable to specify Chronicity:  Recurrent Context: spontaneously   Relieved by:  Nothing Worsened by:  Nothing Ineffective treatments:  None tried Risk factors: unprotected sex    Past Medical History:  Diagnosis Date   Anemia    pt's states not anemic   Headache    Migraines - not on meds    Patient Active Problem List   Diagnosis Date Noted   Encounter for induction of labor 11/23/2017   Low-lying placenta 07/09/2017   Supervision of normal pregnancy, antepartum 05/13/2017   Dyspareunia in female 09/13/2016   Dysmenorrhea 01/30/2016    Past Surgical History:  Procedure Laterality Date   LAPAROSCOPIC LYSIS OF ADHESIONS N/A 07/30/2016   Procedure: LAPAROSCOPIC LYSIS OF ADHESIONS;  Surgeon: [place] Bing, MD;  Location: [facility];  Service: Gynecology;  Laterality: N/A;   LAPAROSCOPY N/A 07/30/2016   Procedure: LAPAROSCOPY DIAGNOSTIC;  Surgeon: [place] Bing, MD;  Location: [facility];  Service: Gynecology;  Laterality: N/A;   WISDOM TOOTH EXTRACTION      OB History     Gravida  3   Para  3    Term  3   Preterm      AB      Living  3      SAB      IAB      Ectopic      Multiple  0   Live Births  3        Obstetric Comments  SVD x 2          Home Medications    Prior to Admission medications   Medication Sig Start Date End Date Taking? Authorizing Provider  metroNIDAZOLE (METROGEL) 0.75 % vaginal gel Place 1 Applicatorful vaginally at bedtime for 7 days. 04/25/21 05/02/21 Yes Theadora Rama Scales, PA-C  ibuprofen (ADVIL,MOTRIN) 600 MG tablet Take 1 tablet (600 mg total) by mouth every 6 (six) hours. 11/25/17   Levie Heritage, DO  Prenatal Vit-Fe Fumarate-FA (PRENATAL MULTIVITAMIN) TABS tablet Take 1 tablet by mouth daily at 12 noon.    [provider]    Family History Family History  Problem Relation Age of Onset   Diabetes Mother    Diabetes Maternal Grandmother     Social History Social History   Tobacco Use   Smoking status: Never   Smokeless tobacco: Never  Vaping Use   Vaping Use: Never used  Substance Use Topics   Alcohol use: No   Drug use: No     Allergies   Flagyl [metronidazole]   Review of Systems Review of Systems  Genitourinary:  Positive for vaginal discharge.  All other systems reviewed and are negative.   Physical Exam Triage Vital Signs ED Triage Vitals  Enc Vitals Group     BP 04/25/21 0848 108/76     Pulse Rate 04/25/21 0848 78     Resp 04/25/21 0848 16     Temp 04/25/21 0848 98.8 F (37.1 C)     Temp Source 04/25/21 0848 Oral     SpO2 04/25/21 0848 96 %     Weight --      Height --      Head Circumference --      Peak Flow --      Pain Score 04/25/21 0847 0     Pain Loc --      Pain Edu? --      Excl. in GC? --    No data found.  Updated Vital Signs BP 108/76 (BP Location: Right Arm)   Pulse 78   Temp 98.8 F (37.1 C) (Oral)   Resp 16   SpO2 96%   Visual Acuity Right Eye Distance:   Left Eye Distance:   Bilateral Distance:    Right Eye Near:   Left Eye Near:     Bilateral Near:     Physical Exam Constitutional:      Appearance: Normal appearance.  HENT:     Head: Normocephalic and atraumatic.  Cardiovascular:     Rate and Rhythm: Normal rate and regular rhythm.     Heart sounds: Normal heart sounds.  Pulmonary:     Effort: Pulmonary effort is normal.     Breath sounds: Normal breath sounds.  Abdominal:     General: Abdomen is flat. Bowel sounds are normal.     Palpations: Abdomen is soft.     Tenderness: There is no abdominal tenderness. There is no right CVA tenderness, left CVA tenderness or guarding.  Genitourinary:    Comments: Patient politely declines pelvic exam today.  Self swab of vagina provided by patient for testing. Musculoskeletal:        General: Normal range of motion.  Skin:    General: Skin is warm and dry.  Neurological:     General: No focal deficit present.     Mental Status: She is alert and oriented to person, place, and time.  Psychiatric:        Mood and Affect: Mood normal.        Behavior: Behavior normal.     UC Treatments / Results  Labs (all labs ordered are listed, but only abnormal results are displayed) Labs Reviewed  CERVICOVAGINAL ANCILLARY ONLY    EKG   Radiology No results found.  Procedures Procedures (including critical care time)  Medications Ordered in UC Medications - No data to display  Initial Impression / Assessment and Plan / UC Course  I have reviewed the triage vital signs and the nursing notes.  Pertinent labs & imaging results that were available during my care of the patient were reviewed by me and considered in my medical decision making (see chart for details).     Patient is requesting renewal of previous prescription for metronidazole vaginal gel.  Patient states she feels like she has had BV because her symptoms today are similar to the symptoms she had with her last episode of bacterial vaginosis.  Optima swab obtained, STD testing will be performed, patient  advised she will be notified of results once they are received.  All questions were addressed during visit today. Final Clinical Impressions(s) / UC  Diagnoses   Final diagnoses:  BV (bacterial vaginosis)  Acute vaginitis     Discharge Instructions      Please begin metronidazole as prescribed.  You will be notified of the results of your Optima swab once they are received.     ED Prescriptions     Medication Sig Dispense Auth. Provider   metroNIDAZOLE (METROGEL) 0.75 % vaginal gel Place 1 Applicatorful vaginally at bedtime for 7 days. 70 g Theadora Rama Scales, PA-C      PDMP not reviewed this encounter.   Theadora Rama Scales, PA-C 04/25/21 5277    Theadora Rama Scales, PA-C 04/25/21 1028

## 2021-04-25 NOTE — ED Triage Notes (Signed)
T reports vaginal discomfort and white vaginal discharge x 3 days.   Pt requested STD's test.

## 2021-04-27 LAB — CERVICOVAGINAL ANCILLARY ONLY
Bacterial Vaginitis (gardnerella): POSITIVE — AB
Candida Glabrata: NEGATIVE
Candida Vaginitis: NEGATIVE
Chlamydia: NEGATIVE
Comment: NEGATIVE
Comment: NEGATIVE
Comment: NEGATIVE
Comment: NEGATIVE
Comment: NEGATIVE
Comment: NORMAL
Neisseria Gonorrhea: NEGATIVE
Trichomonas: NEGATIVE

## 2023-03-18 ENCOUNTER — Telehealth: Payer: Self-pay

## 2023-03-18 NOTE — Telephone Encounter (Signed)
Unable to contact number not working

## 2023-06-26 ENCOUNTER — Encounter: Payer: Self-pay | Admitting: Obstetrics and Gynecology

## 2023-06-26 ENCOUNTER — Ambulatory Visit: Payer: Medicaid Other | Admitting: Obstetrics and Gynecology

## 2023-06-26 ENCOUNTER — Other Ambulatory Visit (HOSPITAL_COMMUNITY)
Admission: RE | Admit: 2023-06-26 | Discharge: 2023-06-26 | Disposition: A | Discharge status: Home / Self Care | Payer: Medicaid Other | Source: Ambulatory Visit | Attending: Obstetrics and Gynecology | Admitting: Obstetrics and Gynecology

## 2023-06-26 VITALS — BP 118/78 | HR 87 | Ht 65.0 in | Wt 141.4 lb

## 2023-06-26 DIAGNOSIS — D1801 Hemangioma of skin and subcutaneous tissue: Secondary | ICD-10-CM | POA: Diagnosis not present

## 2023-06-26 DIAGNOSIS — L918 Other hypertrophic disorders of the skin: Secondary | ICD-10-CM | POA: Diagnosis present

## 2023-06-26 NOTE — Progress Notes (Signed)
Pt reports skin tags appeared in July. Reports some bleeding, denies pain. PAP and STD testing done with PCP.

## 2023-06-26 NOTE — Progress Notes (Signed)
   GYNECOLOGY OFFICE NOTE  History:  32 y.o. G3P3003 here today for vulvar skin tag removal. She first noted it in the summer, thought it was an ingrown hair and she "messed with it" and it bled for 20 min, has not messed with it since. She has stopped waxing due to this. Not painful to touch. Has never had a skin tag before.   Reports pap and STI testing are up to date with PCP, declines contraception discussion.  Past Medical History:  Diagnosis Date   Anemia    pt's states not anemic   Headache    Migraines - not on meds    Past Surgical History:  Procedure Laterality Date   LAPAROSCOPIC LYSIS OF ADHESIONS N/A 07/30/2016   Procedure: LAPAROSCOPIC LYSIS OF ADHESIONS;  Surgeon: [place] Bing, MD;  Location: [facility];  Service: Gynecology;  Laterality: N/A;   LAPAROSCOPY N/A 07/30/2016   Procedure: LAPAROSCOPY DIAGNOSTIC;  Surgeon: [place] Bing, MD;  Location: [facility];  Service: Gynecology;  Laterality: N/A;   WISDOM TOOTH EXTRACTION       Current Outpatient Medications:    Probiotic Product (PROBIOTIC DAILY PO), Take by mouth., Disp: , Rfl:    ibuprofen (ADVIL,MOTRIN) 600 MG tablet, Take 1 tablet (600 mg total) by mouth every 6 (six) hours. (Patient not taking: Reported on 06/26/2023), Disp: 30 tablet, Rfl: 0   Prenatal Vit-Fe Fumarate-FA (PRENATAL MULTIVITAMIN) TABS tablet, Take 1 tablet by mouth daily at 12 noon. (Patient not taking: Reported on 06/26/2023), Disp: , Rfl:   The following portions of the patient's history were reviewed and updated as appropriate: allergies, current medications, past family history, past medical history, past social history, past surgical history and problem list.   Review of Systems:  Pertinent items noted in HPI and remainder of comprehensive ROS otherwise negative.   Objective:  Physical Exam BP 118/78   Pulse 87   Ht 5\' 5"  (1.651 m)   Wt 141 lb 6.4 oz (64.1 kg)   BMI 23.53 kg/m  CONSTITUTIONAL: Well-developed, well-nourished female  in no acute distress.  HENT:  Normocephalic, atraumatic. External right and left ear normal. Oropharynx is clear and moist EYES: Conjunctivae and EOM are normal. Pupils are equal, round, and reactive to light. No scleral icterus.  NECK: Normal range of motion, supple, no masses SKIN: Skin is warm and dry. No rash noted. Not diaphoretic. No erythema. No pallor. NEUROLOGIC: Alert and oriented to person, place, and time. Normal reflexes, muscle tone coordination. No cranial nerve deficit noted. PSYCHIATRIC: Normal mood and affect. Normal behavior. Normal judgment and thought content. CARDIOVASCULAR: Normal heart rate noted RESPIRATORY: Effort normal, no problems with respiration noted ABDOMEN: Soft, no distention noted.  Mons with 2 cm skin tag in left mid aspect.  PELVIC: deferred MUSCULOSKELETAL: Normal range of motion. No edema noted.  Exam done with chaperone present.  Labs and Imaging No results found.  Assessment & Plan:  1. Skin tag Removed, see note - Surgical pathology( [facility]/ [facility])   Routine preventative health maintenance measures emphasized. Please refer to After Visit Summary for other counseling recommendations.   Return if symptoms worsen or fail to improve.   Baldemar Lenis, MD, [facility] Attending Center for [employer] [facility])

## 2023-06-26 NOTE — Progress Notes (Signed)
   Skin Tag Removal Note  Diane [name] [id number]  06/26/2023   The indications for skin tag removal were reviewed. Risks of the removal including pain, bleeding, infection, inadequate specimen, and need for additional procedures were discussed. The patient stated understanding and agreed to undergo procedure today. Consent was signed, and an adequate time out performed.   The patient's vulva was prepped with Betadine. 2 mL 1% lidocaine was injected into the right mons. A 2-mm tag was picked up with sterile forceps and excised in its entirety at the base using an 11 blade. Small amount bleeding was noted and hemostasis was achieved using silver nitrate sticks.    The patient tolerated the procedure well.   Specimens: skin tag from mons  Post-procedure instructions were given to the patient. The patient is to call with heavy bleeding, fever greater than 100.4, foul smelling vaginal discharge or other concerns. The patient will be return to clinic in two weeks for discussion of results.   Baldemar Lenis, M.D. Attending Obstetrician & Gynecologist, [facility] for [employer], [facility]

## 2023-06-28 LAB — SURGICAL PATHOLOGY

## 2024-05-15 ENCOUNTER — Emergency Department (HOSPITAL_COMMUNITY)

## 2024-05-15 ENCOUNTER — Encounter (HOSPITAL_COMMUNITY): Payer: Self-pay | Admitting: *Deleted

## 2024-05-15 ENCOUNTER — Emergency Department (HOSPITAL_COMMUNITY)
Admission: EM | Admit: 2024-05-15 | Discharge: 2024-05-16 | Disposition: A | Discharge status: Home / Self Care | Attending: Emergency Medicine | Admitting: Emergency Medicine

## 2024-05-15 ENCOUNTER — Other Ambulatory Visit: Payer: Self-pay

## 2024-05-15 DIAGNOSIS — R102 Pelvic and perineal pain unspecified side: Secondary | ICD-10-CM | POA: Diagnosis not present

## 2024-05-15 DIAGNOSIS — E876 Hypokalemia: Secondary | ICD-10-CM | POA: Diagnosis not present

## 2024-05-15 DIAGNOSIS — R1011 Right upper quadrant pain: Secondary | ICD-10-CM | POA: Diagnosis present

## 2024-05-15 DIAGNOSIS — N7011 Chronic salpingitis: Secondary | ICD-10-CM | POA: Diagnosis not present

## 2024-05-15 LAB — URINALYSIS, ROUTINE W REFLEX MICROSCOPIC
Bacteria, UA: NONE SEEN
Bilirubin Urine: NEGATIVE
Glucose, UA: NEGATIVE mg/dL
Ketones, ur: NEGATIVE mg/dL
Leukocytes,Ua: NEGATIVE
Nitrite: NEGATIVE
Protein, ur: NEGATIVE mg/dL
Specific Gravity, Urine: 1.017 (ref 1.005–1.030)
pH: 6 (ref 5.0–8.0)

## 2024-05-15 LAB — COMPREHENSIVE METABOLIC PANEL WITH GFR
ALT: 8 U/L (ref 0–44)
AST: 14 U/L — ABNORMAL LOW (ref 15–41)
Albumin: 3.1 g/dL — ABNORMAL LOW (ref 3.5–5.0)
Alkaline Phosphatase: 39 U/L (ref 38–126)
Anion gap: 9 (ref 5–15)
BUN: 5 mg/dL — ABNORMAL LOW (ref 6–20)
CO2: 25 mmol/L (ref 22–32)
Calcium: 8.5 mg/dL — ABNORMAL LOW (ref 8.9–10.3)
Chloride: 104 mmol/L (ref 98–111)
Creatinine, Ser: 0.63 mg/dL (ref 0.44–1.00)
GFR, Estimated: >60 mL/min (ref >60)
Glucose, Bld: 131 mg/dL — ABNORMAL HIGH (ref 70–99)
Potassium: 2.8 mmol/L — ABNORMAL LOW (ref 3.5–5.1)
Sodium: 138 mmol/L (ref 135–145)
Total Bilirubin: 0.5 mg/dL (ref 0.0–1.2)
Total Protein: 6.4 g/dL — ABNORMAL LOW (ref 6.5–8.1)

## 2024-05-15 LAB — CBC
HCT: 29.2 % — ABNORMAL LOW (ref 36.0–46.0)
Hemoglobin: 9.3 g/dL — ABNORMAL LOW (ref 12.0–15.0)
MCH: 28.7 pg (ref 26.0–34.0)
MCHC: 31.8 g/dL (ref 30.0–36.0)
MCV: 90.1 fL (ref 80.0–100.0)
Platelets: 254 K/uL (ref 150–400)
RBC: 3.24 MIL/uL — ABNORMAL LOW (ref 3.87–5.11)
RDW: 14.4 % (ref 11.5–15.5)
WBC: 7.2 K/uL (ref 4.0–10.5)
nRBC: 0 % (ref 0.0–0.2)

## 2024-05-15 LAB — LIPASE, BLOOD: Lipase: 33 U/L (ref 11–51)

## 2024-05-15 LAB — HCG, SERUM, QUALITATIVE: Preg, Serum: NEGATIVE

## 2024-05-15 MED ORDER — POTASSIUM CHLORIDE CRYS ER 20 MEQ PO TBCR: 60.0000 meq | EXTENDED_RELEASE_TABLET | Freq: Once | ORAL | Status: DC

## 2024-05-15 NOTE — ED Triage Notes (Signed)
 Pt reports right lower abdominal pain and right back pain for about 3 days. Nausea. Denies diarrhea, constipation, or fevers. Denies urinary symptoms.

## 2024-05-15 NOTE — ED Provider Notes (Signed)
 [facility] Provider Note   CSN: [id number] Arrival date & time: 05/15/24  8087     Patient presents with: Abdominal Pain   Diane [name] is a 33 y.o. female with right upper quadrant abdominal pain, nausea, exacerbated by eating and radiates to the right shoulder.  Has a history of this intermittently for the last few months but states that it was worse in the past during her menstrual cycles.  She is currently on her menstrual cycle, day 4.  No fevers or vomiting, positive chills at home, no diarrhea, normal bowel movements, no urinary symptoms or abnormal vaginal discharge.  Currently on her menstrual cycle, denies any missed periods.  Not currently on any contraceptive.      HPI     Prior to Admission medications   Medication Sig Start Date End Date Taking? Authorizing Provider  doxycycline (VIBRAMYCIN) 100 MG capsule Take 1 capsule (100 mg total) by mouth 2 (two) times daily. 05/16/24  Yes Avion Kutzer, Pleasant SAUNDERS, PA-C  ibuprofen  (ADVIL ,MOTRIN ) 600 MG tablet Take 1 tablet (600 mg total) by mouth every 6 (six) hours. Patient not taking: Reported on 06/26/2023 11/25/17   Stinson, Jacob J, DO  Prenatal Vit-Fe Fumarate-FA (PRENATAL MULTIVITAMIN) TABS tablet Take 1 tablet by mouth daily at 12 noon. Patient not taking: Reported on 06/26/2023    [provider]  Probiotic Product (PROBIOTIC DAILY PO) Take by mouth.    [provider]    Allergies: Flagyl  [metronidazole ]    Review of Systems  Constitutional:  Positive for chills.  HENT: Negative.    Eyes: Negative.   Respiratory: Negative.    Cardiovascular: Negative.   Gastrointestinal:  Positive for abdominal pain. Negative for diarrhea, nausea and vomiting.  Genitourinary: Negative.   Neurological: Negative.     Updated Vital Signs BP 111/70 (BP Location: Right Arm)   Pulse 76   Temp 98.3 F (36.8 C) (Oral)   Resp 16   LMP 05/11/2024   SpO2 100%    Physical Exam Vitals and nursing note reviewed. Exam conducted with a chaperone present (ED tech Slater.).  Constitutional:      Appearance: She is not ill-appearing or toxic-appearing.  HENT:     Head: Normocephalic and atraumatic.     Mouth/Throat:     Mouth: Mucous membranes are moist.     Pharynx: No oropharyngeal exudate or posterior oropharyngeal erythema.  Eyes:     General:        Right eye: No discharge.        Left eye: No discharge.     Conjunctiva/sclera: Conjunctivae normal.  Cardiovascular:     Rate and Rhythm: Normal rate and regular rhythm.     Pulses: Normal pulses.  Pulmonary:     Effort: Pulmonary effort is normal. No respiratory distress.     Breath sounds: Normal breath sounds. No wheezing or rales.  Abdominal:     General: Bowel sounds are normal. There is no distension.     Palpations: Abdomen is soft.     Tenderness: There is abdominal tenderness in the right upper quadrant and epigastric area. There is no right CVA tenderness, left CVA tenderness, guarding or rebound. Positive signs include Murphy's sign. Negative signs include Rovsing's sign and McBurney's sign.  Genitourinary:    General: Normal vulva.     Vagina: Normal.     Cervix: Cervical bleeding present. No cervical motion tenderness.     Uterus: Normal.  Adnexa: Left adnexa normal.       Right: Fullness present.      Comments: GU exam as above with scant blood from the eyes but no purulence/discharge, no CMT.   Musculoskeletal:        General: No deformity.     Cervical back: Neck supple.  Skin:    General: Skin is warm and dry.     Capillary Refill: Capillary refill takes less than 2 seconds.  Neurological:     General: No focal deficit present.     Mental Status: She is alert and oriented to person, place, and time. Mental status is at baseline.  Psychiatric:        Mood and Affect: Mood normal.     (all labs ordered are listed, but only abnormal results are displayed) Labs  Reviewed  WET PREP, GENITAL - Abnormal; Notable for the following components:      Result Value   Clue Cells Wet Prep HPF POC PRESENT (*)    WBC, Wet Prep HPF POC >=10 (*)    All other components within normal limits  COMPREHENSIVE METABOLIC PANEL WITH GFR - Abnormal; Notable for the following components:   Potassium 2.8 (*)    Glucose, Bld 131 (*)    BUN 5 (*)    Calcium 8.5 (*)    Total Protein 6.4 (*)    Albumin 3.1 (*)    AST 14 (*)    All other components within normal limits  CBC - Abnormal; Notable for the following components:   RBC 3.24 (*)    Hemoglobin 9.3 (*)    HCT 29.2 (*)    All other components within normal limits  URINALYSIS, ROUTINE W REFLEX MICROSCOPIC - Abnormal; Notable for the following components:   Hgb urine dipstick MODERATE (*)    All other components within normal limits  LIPASE, BLOOD  HCG, SERUM, QUALITATIVE  GC/CHLAMYDIA PROBE AMP ([place]) NOT AT [facility]    EKG: None  Radiology: US  PELVIC COMPLETE W TRANSVAGINAL AND TORSION R/O Result Date: 05/16/2024 CLINICAL DATA:  Right lower quadrant pain. The CT demonstrated proteinaceous ascites versus hemoperitoneum, and or suspected evolving bilateral hydrosalpinx. EXAM: TRANSABDOMINAL AND TRANSVAGINAL ULTRASOUND OF PELVIS DOPPLER ULTRASOUND OF OVARIES TECHNIQUE: Both transabdominal and transvaginal ultrasound examinations of the pelvis were performed. Transabdominal technique was performed for global imaging of the pelvis including uterus, ovaries, adnexal regions, and pelvic cul-de-sac. It was necessary to proceed with endovaginal exam following the transabdominal exam to visualize the adnexal areas, ovaries and endometrium better. Color and duplex Doppler ultrasound was utilized to evaluate blood flow to the ovaries. COMPARISON:  CT earlier today. FINDINGS: Uterus Measurements: Anteverted measuring 9.5 x 4.7 x 6.3 cm = volume: 147 mL. No fibroids or other mass visualized. Endometrium Thickness: 7 mm. No  focal abnormality visualized, but there is a very small volume of anechoic fluid within the cavity. Right ovary Measurements: 4.5 x 2.5 x 2.1 cm = volume: 12.3 mL. Normal appearance/no adnexal mass. There is a thin walled elongate tubular structure in the right adnexal space containing anechoic fluid and measuring 2.3 x 1 x 1.2 cm, most likely due to a hydrosalpinx. Left ovary Measurements: 5.3 x 3.3 x 2.4 cm = volume: 15.5 mL. Normal appearance/no adnexal mass. No hydrosalpinx is seen on this side. Pulsed Doppler evaluation of both ovaries demonstrates normal low-resistance arterial and venous waveforms. Other findings There is a moderate volume of complex pelvic fluid consistent with a hemoperitoneum. Gyn consult recommended unless  already done. IMPRESSION: 1. Moderate volume of complex pelvic fluid consistent with a hemoperitoneum. GYN consult recommended unless already done. 2. Thin walled elongate tubular structure in the right adnexal space containing anechoic fluid and measuring 2.3 x 1 x 1.2 cm, most likely due to a hydrosalpinx. 3. No evidence of ovarian torsion or mass. 4. Small volume of anechoic fluid within the endometrial cavity. Electronically Signed   By: Francis Quam M.D.   On: 05/16/2024 05:49   CT ABDOMEN PELVIS W CONTRAST Result Date: 05/16/2024 EXAM: CT ABDOMEN AND PELVIS WITH CONTRAST 05/16/2024 01:19:47 AM TECHNIQUE: CT of the abdomen and pelvis was performed with the administration of intravenous contrast. Multiplanar reformatted images are provided for review. Automated exposure control, iterative reconstruction, and/or weight-based adjustment of the mA/kV was utilized to reduce the radiation dose to as low as reasonably achievable. COMPARISON: None available. CLINICAL HISTORY: RLQ abdominal pain. Right lower abdominal pain and right back pain for about 3 days. Nausea. Denies diarrhea, constipation, or fevers. Denies urinary symptoms. FINDINGS: LOWER CHEST: No acute abnormality. LIVER:  The liver is unremarkable. GALLBLADDER AND BILE DUCTS: Gallbladder is unremarkable. No biliary ductal dilatation. SPLEEN: No acute abnormality. PANCREAS: No acute abnormality. ADRENAL GLANDS: No acute abnormality. KIDNEYS, URETERS AND BLADDER: No stones in the kidneys or ureters. No hydronephrosis. No perinephric or periureteral stranding. Urinary bladder is unremarkable. GI AND BOWEL: Appendix is normal. The stomach, small bowel, and large bowel are otherwise unremarkable. PERITONEUM AND RETROPERITONEUM: There is mild ascites present which appears slightly hyperdense suggesting proteinaceous or hemorrhagic fluid. No free air. VASCULATURE: Aorta is normal in caliber. LYMPH NODES: No lymphadenopathy. REPRODUCTIVE ORGANS: Fluid-filled structures within the adnexa bilaterally suggest bilateral Hydrosalpinx. The pelvic organs are otherwise unremarkable. BONES AND SOFT TISSUES: No acute osseous abnormality. No focal soft tissue abnormality. IMPRESSION: 1. No acute findings. 2. Mild ascites, slightly hyperdense, suggesting proteinaceous or hemorrhagic fluid. 3. Bilateral hydrosalpinx. Correlation for evidence of pelvic inflammatory disease is recommended. Electronically signed by: Dorethia Molt MD 05/16/2024 01:33 AM EDT RP Workstation: [id number]   US  Abdomen Limited RUQ (LIVER/GB) Result Date: 05/16/2024 CLINICAL DATA:  Right upper quadrant abdominal pain. EXAM: ULTRASOUND ABDOMEN LIMITED RIGHT UPPER QUADRANT COMPARISON:  None Available. FINDINGS: Gallbladder: No gallstones or wall thickening visualized (2.8 mm). There is a small amount of pericholecystic fluid versus ascites. No sonographic Murphy sign noted by sonographer. Common bile duct: Diameter: 2.2 mm Liver: No focal lesion identified. Diffusely increased echogenicity of the liver parenchyma is noted. Portal vein is patent on color Doppler imaging with normal direction of blood flow towards the liver. Other: A small amount of ascites is noted around the liver  and within Morrison's pouch. IMPRESSION: 1. Small amount of pericholecystic fluid and ascites without evidence of cholelithiasis or acute cholecystitis. 2. Hepatic steatosis. Electronically Signed   By: Suzen Dials M.D.   On: 05/16/2024 00:09     Procedures   Medications Ordered in the ED  potassium chloride 10 mEq in 100 mL IVPB (0 mEq Intravenous Stopped 05/16/24 0629)  iohexol (OMNIPAQUE) 350 MG/ML injection 75 mL (75 mLs Intravenous Contrast Given 05/16/24 0120)    Clinical Course as of 05/16/24 9362  Sat May 16, 2024  0600 Consult to Dr. Jayne, OB/GYN, who agrees with suspected diagnosis of hemorrhagic cyst that ruptured in context of her menstrual period.  He recommends Doxy x 10 days, for coverage of possible resolving pyosalpinx now likely just hydrosalpinx on her imaging.  Recommends outpatient follow-up with her GYN in the office.  No further intervention warranted in the ED tonight.  I appreciate the collaboration in the care of this patient. [RS]    Clinical Course User Index [RS] Jerrick Farve, Pleasant SAUNDERS, PA-C                                 Medical Decision Making 33 year old female with right upper quadrant pain, nausea.  Vitals normal intake.  Cardiopulmonary exam unremarkable, abdominal exam with positive Murphy sign and right upper quadrant epigastric tenderness palpation without rebound or guarding.  Patient overall well-appearing with reassuring vitals.  The differential diagnosis for RUQ includes but is not limited to:  Cholelithiasis / choledocholithiasis / cholecystitis / cholangitis, hepatitis (eg. viral, alcoholic, toxic),liver abscess, pancreatitis, liver / pancreatic / biliary tract cancer, ischemic hepatopathy (shock liver), hepatic vein obstruction (Budd-Chiari syndrome), liver cell adenoma, peptic ulcer disease (duodenal), functional or nonulcer dyspepsia, right lower lobe pneumonia, pyelonephritis, urinary calculi,  Fitz-Hugh-Curtis syndrome (with pelvic  inflammatory disease), herpes zoster, trauma or musculoskeletal pain, herniated disk, abdominal abscess, intestinal ischemia, physical or sexual abuse, ectopic pregnancy, IUP, Mittelschmerz, ovarian cyst/torsion, threatened/ievitable abortion, PID, endometriosis, molar pregnancy, heterotopic pregnancy, corpus luteum cyst, appendicitis, UTI/renal colic, IBD.    Amount and/or Complexity of Data Reviewed Labs: ordered.    Details: CBC with anemia with hemoglobin of 9 near patient's baseline of 10.  CMP with hypokalemia of 2.8, normal renal and hepatic function.  Lipase is 33, pregnancy is negative, UA with hemoglobinuria currently on her menstrual period. Radiology: ordered.    Details: Right upper quadrant ultrasound with small amount of Perry cholecystic fluid without evidence of cholelithiasis or cholecystitis.  CT abdomen pelvis with mild ascites, hyperdense suggesting hemorrhagic or proteinaceous material, bilateral hydrosalpinx.  Pelvic ultrasound with normal dopplerable blood flow to both ovaries, moderate volume hemoperitoneum and right sided hydrosalpinx.   Risk Prescription drug management.   Consult to OB/GYN as above, clinical picture most consistent with ruptured hemorrhagic cyst.  Will treat with doxycycline for hydrosalpinx/possible resolving pyosalpinx.  Recommend close outpatient follow-up with OB/GYN.  Clinical concern for emergent underlying condition that would warrant further ED workup and patient management is exceedingly low.  Tamu  voiced understanding of her medical evaluation and treatment plan. Each of their questions answered to their expressed satisfaction.  Return precautions were given.  Patient is well-appearing, stable, and was discharged in good condition.  This chart was dictated using voice recognition software, Dragon. Despite the best efforts of this provider to proofread and correct errors, errors may still occur which can change documentation meaning.       Final diagnoses:  Pelvic pain in female  Hydrosalpinx    ED Discharge Orders          Ordered    doxycycline (VIBRAMYCIN) 100 MG capsule  2 times daily        05/16/24 0608               Lamari Youngers, Pleasant SAUNDERS, PA-C 05/16/24 9362    Ruthe Cornet, DO 05/18/24 9296

## 2024-05-15 NOTE — ED Triage Notes (Signed)
 Patient c/o RLQ pain x 3 days with hot sweats and radiates to her shoulder, eating makes it worse, nothing makes it better. No hx of abdominal surgery.

## 2024-05-16 ENCOUNTER — Emergency Department (HOSPITAL_COMMUNITY)

## 2024-05-16 LAB — WET PREP, GENITAL
Sperm: NONE SEEN
Trich, Wet Prep: NONE SEEN
WBC, Wet Prep HPF POC: >=10 — AB (ref <10)
Yeast Wet Prep HPF POC: NONE SEEN

## 2024-05-16 MED ORDER — IOHEXOL 350 MG/ML SOLN
75.0000 mL | Freq: Once | INTRAVENOUS | Status: AC | PRN
  Administered 2024-05-16: 75 mL via INTRAVENOUS

## 2024-05-16 MED ORDER — POTASSIUM CHLORIDE 10 MEQ/100ML IV SOLN
10.0000 meq | INTRAVENOUS | Status: AC
  Administered 2024-05-16 (×2): 10 meq via INTRAVENOUS
  Filled 2024-05-16 (×2): qty 100

## 2024-05-16 MED ORDER — DOXYCYCLINE HYCLATE 100 MG PO CAPS: 100.0000 mg | ORAL_CAPSULE | Freq: Two times a day (BID) | ORAL | 0 refills | Status: DC

## 2024-05-16 NOTE — Discharge Instructions (Signed)
 Abdominal pain.  You have some blood in your belly likely related to a ruptured ovarian cyst.  Additionally has some swelling to your fallopian tube for which you have been started on antibiotics.  Please take antibiotics for entire course and follow-up closely with the OB/GYN listed below.  Return to the ER with any severe symptoms.

## 2024-05-18 LAB — GC/CHLAMYDIA PROBE AMP (~~LOC~~) NOT AT ARMC
Chlamydia: NEGATIVE
Comment: NEGATIVE
Comment: NORMAL
Neisseria Gonorrhea: NEGATIVE

## 2024-06-01 ENCOUNTER — Ambulatory Visit (INDEPENDENT_AMBULATORY_CARE_PROVIDER_SITE_OTHER): Admitting: Obstetrics

## 2024-06-01 ENCOUNTER — Encounter: Payer: Self-pay | Admitting: Obstetrics

## 2024-06-01 VITALS — BP 108/71 | HR 86 | Ht 65.0 in | Wt 129.8 lb

## 2024-06-01 DIAGNOSIS — A5611 Chlamydial female pelvic inflammatory disease: Secondary | ICD-10-CM

## 2024-06-01 DIAGNOSIS — D508 Other iron deficiency anemias: Secondary | ICD-10-CM

## 2024-06-01 DIAGNOSIS — N946 Dysmenorrhea, unspecified: Secondary | ICD-10-CM | POA: Diagnosis not present

## 2024-06-01 MED ORDER — ACCRUFER 30 MG PO CAPS: 1.0000 | ORAL_CAPSULE | Freq: Two times a day (BID) | ORAL | 3 refills | Status: AC

## 2024-06-01 MED ORDER — VITAFOL ULTRA 29-0.6-0.4-200 MG PO CAPS: 1.0000 | ORAL_CAPSULE | Freq: Every day | ORAL | 4 refills | Status: AC

## 2024-06-01 MED ORDER — IBUPROFEN 800 MG PO TABS: 800.0000 mg | ORAL_TABLET | Freq: Three times a day (TID) | ORAL | 5 refills | Status: AC | PRN

## 2024-06-01 MED ORDER — CLINDAMYCIN HCL 300 MG PO CAPS: 300.0000 mg | ORAL_CAPSULE | Freq: Three times a day (TID) | ORAL | 0 refills | Status: DC

## 2024-06-01 NOTE — Progress Notes (Signed)
 Patient ID: Diane [name], female   DOB: 08-Jun-1991, 33 y.o.   MRN: [id number]  Chief Complaint  Patient presents with   Ovarian Cyst    HPI Diane [name] is a 33 y.o. female.  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  Evaluated in ER for severe dysmenorrhea, and Ultrasound and CT Scan was c/w bilateral Hydrosalpinx.  She states that she had positive Chlamydia probes this past Spring.  She was treated with 10 days of Doxy starting on 05/16/24.  She is currently asymptomatic except when menstruating., which causes severe pelvic, RUQ and back pain.  PSH is significant for Operative Laparoscopy with Lysis of Adhesions in 2017. HPI  Past Medical History:  Diagnosis Date   Anemia    pt's states not anemic   Headache    Migraines - not on meds    Past Surgical History:  Procedure Laterality Date   LAPAROSCOPIC LYSIS OF ADHESIONS N/A 07/30/2016   Procedure: LAPAROSCOPIC LYSIS OF ADHESIONS;  Surgeon: Bebe Furry, MD;  Location: [facility];  Service: Gynecology;  Laterality: N/A;   LAPAROSCOPY N/A 07/30/2016   Procedure: LAPAROSCOPY DIAGNOSTIC;  Surgeon: Bebe Furry, MD;  Location: [facility];  Service: Gynecology;  Laterality: N/A;   WISDOM TOOTH EXTRACTION      Family History  Problem Relation Age of Onset   Diabetes Mother    Diabetes Maternal Grandmother     Social History Social History   Tobacco Use   Smoking status: Never   Smokeless tobacco: Never  Vaping Use   Vaping status: Never Used  Substance Use Topics   Alcohol use: No   Drug use: No    Allergies  Allergen Reactions   Flagyl  [Metronidazole ] Itching    Current Outpatient Medications  Medication Sig Dispense Refill   clindamycin  (CLEOCIN ) 300 MG capsule Take 1 capsule (300 mg total) by mouth 3 (three) times daily. 30 capsule 0   Ferric Maltol (ACCRUFER) 30 MG CAPS Take 1 capsule (30 mg total) by mouth 2 (two) times daily before a meal. Take 2 hrs before, or 2 hrs after a meal.  60 capsule 3   ibuprofen  (ADVIL ) 800 MG tablet Take 1 tablet (800 mg total) by mouth every 8 (eight) hours as needed. 30 tablet 5   Prenat-Fe Poly-Methfol-FA-DHA (VITAFOL ULTRA) 29-0.6-0.4-200 MG CAPS Take 1 capsule by mouth daily before breakfast. 90 capsule 4   doxycycline (VIBRAMYCIN) 100 MG capsule Take 1 capsule (100 mg total) by mouth 2 (two) times daily. (Patient not taking: Reported on 06/01/2024) 20 capsule 0   ibuprofen  (ADVIL ,MOTRIN ) 600 MG tablet Take 1 tablet (600 mg total) by mouth every 6 (six) hours. (Patient not taking: Reported on 06/01/2024) 30 tablet 0   Probiotic Product (PROBIOTIC DAILY PO) Take by mouth. (Patient not taking: Reported on 06/01/2024)     No current facility-administered medications for this visit.    Review of Systems Review of Systems Constitutional: negative for fatigue and weight loss Respiratory: negative for cough and wheezing Cardiovascular: negative for chest pain, fatigue and palpitations Gastrointestinal: negative for abdominal pain and change in bowel habits Genitourinary: positive for severe dysmenorrhea Integument/breast: negative for nipple discharge Musculoskeletal:negative for myalgias Neurological: negative for gait problems and tremors Behavioral/Psych: negative for abusive relationship, depression Endocrine: negative for temperature intolerance     Blood pressure 108/71, pulse 86, height 5' 5 (1.651 m), weight 129 lb 12.8 oz (58.9 kg), last menstrual period 05/12/2024.  Physical Exam Physical Exam General:   Alert  and no distress  Skin:   no rash or abnormalities  Lungs:   clear to auscultation bilaterally  Heart:   regular rate and rhythm, S1, S2 normal, no murmur, click, rub or gallop  Breasts:   Not examined  Abdomen:  normal findings: no organomegaly, soft, non-tender and no hernia  The remainder of the physical exam deferred due to the type of encounter  I have spent a total of 20 minutes of face-to-face time, excluding  clinical staff time, reviewing notes and preparing to see patient, ordering tests and/or medications, and counseling the patient.   Data Reviewed Wet Prep GC / Chlamydia probes Notes in chart  Assessment     1. Chlamydial pelvic inflammatory disease (Primary), chronic, treated  2. Severe dysmenorrhea - ibuprofen  (ADVIL ) 800 MG tablet; Take 1 tablet (800 mg total) by mouth every 8 (eight) hours as needed.  Dispense: 30 tablet; Refill: 5  3. Iron deficiency anemia secondary to inadequate dietary iron intake Rx: - Ferric Maltol (ACCRUFER) 30 MG CAPS; Take 1 capsule (30 mg total) by mouth 2 (two) times daily before a meal. Take 2 hrs before, or 2 hrs after a meal.  Dispense: 60 capsule; Refill: 3 - Prenat-Fe Poly-Methfol-FA-DHA (VITAFOL ULTRA) 29-0.6-0.4-200 MG CAPS; Take 1 capsule by mouth daily before breakfast.  Dispense: 90 capsule; Refill: 4     Plan   Follow up in 4-6 weeks  Meds ordered this encounter  Medications   ibuprofen  (ADVIL ) 800 MG tablet    Sig: Take 1 tablet (800 mg total) by mouth every 8 (eight) hours as needed.    Dispense:  30 tablet    Refill:  5   Ferric Maltol (ACCRUFER) 30 MG CAPS    Sig: Take 1 capsule (30 mg total) by mouth 2 (two) times daily before a meal. Take 2 hrs before, or 2 hrs after a meal.    Dispense:  60 capsule    Refill:  3   Prenat-Fe Poly-Methfol-FA-DHA (VITAFOL ULTRA) 29-0.6-0.4-200 MG CAPS    Sig: Take 1 capsule by mouth daily before breakfast.    Dispense:  90 capsule    Refill:  4   clindamycin  (CLEOCIN ) 300 MG capsule    Sig: Take 1 capsule (300 mg total) by mouth 3 (three) times daily.    Dispense:  30 capsule    Refill:  0     CARLIN RONAL CENTERS, MD, FACOG Attending Obstetrician & Gynecologist, [facility] for [facility], [facility], [place] 06/01/2024

## 2024-06-01 NOTE — Progress Notes (Signed)
 Pt. Presents for ed f/u for ruptured ovarian cyst and swollen fallopian tubes.No other questions or concerns at this time.

## 2024-07-24 ENCOUNTER — Ambulatory Visit: Admitting: Obstetrics and Gynecology

## 2024-07-24 VITALS — BP 104/70 | HR 108 | Wt 134.0 lb

## 2024-07-24 DIAGNOSIS — R102 Pelvic and perineal pain unspecified side: Secondary | ICD-10-CM | POA: Diagnosis not present

## 2024-07-24 DIAGNOSIS — N939 Abnormal uterine and vaginal bleeding, unspecified: Secondary | ICD-10-CM

## 2024-07-24 MED ORDER — BACLOFEN 5 MG PO TABS: 10.0000 mg | ORAL_TABLET | Freq: Two times a day (BID) | ORAL | 1 refills | Status: AC | PRN

## 2024-07-24 NOTE — Progress Notes (Signed)
 GYNECOLOGY VISIT  Patient name: Diane [name] MRN [id number]  Date of birth: 1991-01-14 Chief Complaint:   Consult and Follow-up   History:   Since August and worsens with the period. Believes end of April wa sdiagnosed with chlamydia and bleieves she may have ahad it for a while - was not aware that she had it, though tit was a simple bacterial infection. Thinks it may have been present for about three months prior to treatment and then given antibiotics for it. When she was tested for it, it took forever to get results due to the sample being lost, further delaying her treatment. Had pain in July while on vacation. Pain feels like a stabbing sensation and feels like it's the entire right side of her abdomen and then has separate cramping pain with the menses in pelvix. Pain lasts for a few hours at a time, may have a few minutes of comfort. Can't be in the same position for too long. Ibuprofen  has been helping - taking every 8 hours. The week leading into the period and the week of period. Heaviness is different for the menses, previously 7 days, but now 4-5 days. Pads, changing every 1-1.5hr - close to full when changing it. No intention of future pregnancies. Not currently sexually active.   2017 surgery done due to having really bad periods, including passing large blood clots. Was told they see scarring - and since then the periods improved. Was not told there was any endometriosis, just scarring.    The following portions of the patient's history were reviewed and updated as appropriate: allergies, current medications, past family history, past medical history, past social history, past surgical history and problem list.   Health Maintenance:   Last pap     Component Value Date/Time   DIAGPAP  05/14/2017 0000    NEGATIVE FOR INTRAEPITHELIAL LESIONS OR MALIGNANCY.   ADEQPAP  05/14/2017 0000    Satisfactory for evaluation  endocervical/transformation zone component PRESENT.     Health Maintenance  Topic Date Due   COVID-19 Vaccine (1) Never done   Hepatitis C Screening  Never done   DTaP/Tdap/Td vaccine (1 - Tdap) Never done   Hepatitis B Vaccine (1 of 3 - 19+ 3-dose series) Never done   HPV Vaccine (1 - Risk 3-dose SCDM series) Never done   Pap with HPV screening  05/14/2022   Flu Shot  Never done   HIV Screening  Completed   Pneumococcal Vaccine  Aged Out   Meningitis B Vaccine  Aged Out      Review of Systems:  Pertinent items are noted in HPI. Comprehensive review of systems was otherwise negative.   Objective:  Physical Exam BP 104/70   Pulse (!) 108   Wt 134 lb (60.8 kg)   LMP 07/03/2024   BMI 22.30 kg/m    Physical Exam Vitals and nursing note reviewed. Exam conducted with a chaperone present.  Constitutional:      Appearance: Normal appearance.  HENT:     Head: Normocephalic and atraumatic.  Pulmonary:     Effort: Pulmonary effort is normal.     Breath sounds: Normal breath sounds.  Abdominal:     Palpations: Abdomen is soft.     Tenderness: There is no abdominal tenderness.  Genitourinary:    General: Normal vulva.     Exam position: Lithotomy position.     Vagina: Normal.     Cervix: Normal.     Comments: Normal appearing vulva  Normal vulvar sensation bilaterally Nontender superficial pelvic floor muscles Nontender ischial tuberosities bilaterally  Allodynia at introitus: No  Anal wink present Posterior vaginal wall nontender Right levator ani 1/10 Right ischiococcygeous 1/10 Right obturator internus 1/10 Left levator ani 1/10 Left ischioccocygeous 1/10 Left obturator internus 1/10 Anterior vaginal wall nontender Uterine tenderness not present No CMT No Adnexal tenderness   Skin:    General: Skin is warm and dry.  Neurological:     General: No focal deficit present.     Mental Status: She is alert.  Psychiatric:        Mood and Affect: Mood normal.        Behavior: Behavior normal.        Thought Content:  Thought content normal.        Judgment: Judgment normal.           Assessment & Plan:   1. Pelvic and perineal pain (Primary) 2. Abnormal uterine bleeding (AUB) Reviewed possible etiologies for pain including sequelae of PID, adhesive disease, persistent infection, MSK, etc. Exam without evidence of myalgia and unable to reproduce pain on physical examination. Discussed options of continued analgesia, menstrual suppression and surgery. CT A/P ordered given wide ranging right sided abdominal pain when it occurs and possible sequelae. Noted that adhesive disease not able to be fully visualized on CT but can see if there are any signs of continued inflammation and if hydrosalpinges still present. Trial of muscle relaxer for pain - noted possible drowsiness with medication.  - CT ABDOMEN PELVIS W CONTRAST; Future - baclofen 5 MG TABS; Take 2 tablets (10 mg total) by mouth 2 (two) times daily as needed for muscle spasms.  Dispense: 60 tablet; Refill: 1     Carter Quarry, MD [facility]

## 2024-07-24 NOTE — Progress Notes (Signed)
 Pt is in office to discuss pelvic pain, pain has been the past few months.  Pain will be worse during cycles. Pt was seen at hospital for pain in October.  Pt states pain is sharp in the lower right side.   Pt was seen by Dr Rudy in October - states pain is somewhat better since. Pt states Ibuprofen  has helped.

## 2024-07-29 ENCOUNTER — Telehealth: Payer: Self-pay | Admitting: Obstetrics and Gynecology

## 2024-07-29 NOTE — Telephone Encounter (Signed)
 Called [facility]  tomorrow 07/30/2024 @ 1700. P2P Callback [phone] Ref. #[id number].  Reviewed patient hx, emphasizing continued pain that extends throughout the right side in addition to previously visualized  Given approval recommendation - updated clinical information to be sent to next step for final approval and authorization

## 2024-07-30 ENCOUNTER — Ambulatory Visit (HOSPITAL_COMMUNITY)

## 2024-08-03 ENCOUNTER — Ambulatory Visit (HOSPITAL_BASED_OUTPATIENT_CLINIC_OR_DEPARTMENT_OTHER)
Admission: RE | Admit: 2024-08-03 | Discharge: 2024-08-03 | Disposition: A | Discharge status: Home / Self Care | Source: Ambulatory Visit | Attending: Obstetrics and Gynecology | Admitting: Obstetrics and Gynecology

## 2024-08-03 DIAGNOSIS — R102 Pelvic and perineal pain unspecified side: Secondary | ICD-10-CM | POA: Diagnosis present

## 2024-08-03 MED ORDER — IOHEXOL 300 MG/ML  SOLN
100.0000 mL | Freq: Once | INTRAMUSCULAR | Status: AC | PRN
  Administered 2024-08-03: 100 mL via INTRAVENOUS

## 2024-08-05 ENCOUNTER — Ambulatory Visit: Payer: Self-pay | Admitting: Obstetrics and Gynecology
